# Patient Record
Sex: Male | Born: 1965 | Race: Black or African American | Hispanic: No | Marital: Married | Smoking: Current every day smoker
Health system: Southern US, Community
[De-identification: ages and names within clinical notes are randomized; demographics above are authoritative.]

## PROBLEM LIST (undated history)

## (undated) DIAGNOSIS — J449 Chronic obstructive pulmonary disease, unspecified: Secondary | ICD-10-CM

## (undated) DIAGNOSIS — Z789 Other specified health status: Secondary | ICD-10-CM

## (undated) DIAGNOSIS — K4090 Unilateral inguinal hernia, without obstruction or gangrene, not specified as recurrent: Secondary | ICD-10-CM

---

## 1999-05-03 ENCOUNTER — Encounter: Payer: Self-pay | Admitting: Emergency Medicine

## 1999-05-03 ENCOUNTER — Emergency Department (HOSPITAL_COMMUNITY): Admission: EM | Admit: 1999-05-03 | Discharge: 1999-05-03 | Payer: Self-pay | Admitting: Emergency Medicine

## 2000-09-19 ENCOUNTER — Emergency Department (HOSPITAL_COMMUNITY): Admission: EM | Admit: 2000-09-19 | Discharge: 2000-09-19 | Payer: Self-pay | Admitting: Emergency Medicine

## 2000-12-09 ENCOUNTER — Emergency Department (HOSPITAL_COMMUNITY): Admission: EM | Admit: 2000-12-09 | Discharge: 2000-12-09 | Payer: Self-pay | Admitting: Emergency Medicine

## 2001-12-10 ENCOUNTER — Emergency Department (HOSPITAL_COMMUNITY): Admission: EM | Admit: 2001-12-10 | Discharge: 2001-12-10 | Payer: Self-pay | Admitting: Emergency Medicine

## 2004-10-30 ENCOUNTER — Emergency Department (HOSPITAL_COMMUNITY): Admission: EM | Admit: 2004-10-30 | Discharge: 2004-10-31 | Payer: Self-pay | Admitting: Emergency Medicine

## 2004-12-21 ENCOUNTER — Emergency Department (HOSPITAL_COMMUNITY): Admission: EM | Admit: 2004-12-21 | Discharge: 2004-12-21 | Payer: Self-pay | Admitting: Emergency Medicine

## 2006-02-05 ENCOUNTER — Emergency Department (HOSPITAL_COMMUNITY): Admission: EM | Admit: 2006-02-05 | Discharge: 2006-02-05 | Payer: Self-pay | Admitting: Emergency Medicine

## 2006-03-04 ENCOUNTER — Emergency Department (HOSPITAL_COMMUNITY): Admission: EM | Admit: 2006-03-04 | Discharge: 2006-03-04 | Payer: Self-pay | Admitting: Emergency Medicine

## 2006-03-14 ENCOUNTER — Encounter: Admission: RE | Admit: 2006-03-14 | Discharge: 2006-03-14 | Payer: Self-pay | Admitting: Surgery

## 2006-03-15 ENCOUNTER — Encounter: Admission: RE | Admit: 2006-03-15 | Discharge: 2006-03-15 | Payer: Self-pay | Admitting: Surgery

## 2010-08-20 ENCOUNTER — Encounter: Payer: Self-pay | Admitting: Surgery

## 2012-06-29 ENCOUNTER — Encounter (HOSPITAL_COMMUNITY): Payer: Self-pay | Admitting: *Deleted

## 2012-06-29 ENCOUNTER — Emergency Department (HOSPITAL_COMMUNITY): Payer: No Typology Code available for payment source

## 2012-06-29 ENCOUNTER — Emergency Department (HOSPITAL_COMMUNITY)
Admission: EM | Admit: 2012-06-29 | Discharge: 2012-06-29 | Disposition: A | Discharge status: Home / Self Care | Payer: No Typology Code available for payment source | Attending: Emergency Medicine | Admitting: Emergency Medicine

## 2012-06-29 DIAGNOSIS — S0990XA Unspecified injury of head, initial encounter: Secondary | ICD-10-CM | POA: Insufficient documentation

## 2012-06-29 DIAGNOSIS — Y92410 Unspecified street and highway as the place of occurrence of the external cause: Secondary | ICD-10-CM | POA: Insufficient documentation

## 2012-06-29 DIAGNOSIS — F17200 Nicotine dependence, unspecified, uncomplicated: Secondary | ICD-10-CM | POA: Insufficient documentation

## 2012-06-29 DIAGNOSIS — J329 Chronic sinusitis, unspecified: Secondary | ICD-10-CM | POA: Insufficient documentation

## 2012-06-29 DIAGNOSIS — S199XXA Unspecified injury of neck, initial encounter: Secondary | ICD-10-CM | POA: Insufficient documentation

## 2012-06-29 DIAGNOSIS — S0993XA Unspecified injury of face, initial encounter: Secondary | ICD-10-CM | POA: Insufficient documentation

## 2012-06-29 DIAGNOSIS — Y9389 Activity, other specified: Secondary | ICD-10-CM | POA: Insufficient documentation

## 2012-06-29 MED ORDER — CYCLOBENZAPRINE HCL 10 MG PO TABS: 10.0000 mg | ORAL_TABLET | Freq: Two times a day (BID) | ORAL | Status: DC | PRN

## 2012-06-29 MED ORDER — IBUPROFEN 400 MG PO TABS
600.0000 mg | ORAL_TABLET | Freq: Once | ORAL | Status: AC
  Administered 2012-06-29: 600 mg via ORAL
  Filled 2012-06-29: qty 3

## 2012-06-29 NOTE — ED Notes (Signed)
PT ambulated with baseline gait; VSS; A&Ox3; no signs of distress; respirations even and unlabored; skin warm and dry; no questions upon discharge.  

## 2012-06-29 NOTE — ED Notes (Signed)
Pt c/o neck pain radiating to head; reports he has a migraine. Reports neck tender to touch.
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
## 2012-06-29 NOTE — ED Notes (Signed)
C collar placed at this time.

## 2012-06-29 NOTE — ED Notes (Signed)
Pt returned from CT and xray; reports pain is better.

## 2012-06-29 NOTE — ED Notes (Signed)
Patient transported to X-ray 

## 2012-07-01 NOTE — ED Provider Notes (Signed)
Medical screening examination/treatment/procedure(s) were performed by non-physician practitioner and as supervising physician I was immediately available for consultation/collaboration.  Celene Kras, MD 07/01/12 [phone]

## 2013-01-05 ENCOUNTER — Encounter (HOSPITAL_COMMUNITY): Payer: Self-pay | Admitting: *Deleted

## 2013-01-05 ENCOUNTER — Emergency Department (INDEPENDENT_AMBULATORY_CARE_PROVIDER_SITE_OTHER)
Admission: EM | Admit: 2013-01-05 | Discharge: 2013-01-05 | Disposition: A | Discharge status: Home / Self Care | Payer: Self-pay | Source: Home / Self Care | Attending: Family Medicine | Admitting: Family Medicine

## 2013-01-05 DIAGNOSIS — S46812A Strain of other muscles, fascia and tendons at shoulder and upper arm level, left arm, initial encounter: Secondary | ICD-10-CM

## 2013-01-05 DIAGNOSIS — S46819A Strain of other muscles, fascia and tendons at shoulder and upper arm level, unspecified arm, initial encounter: Secondary | ICD-10-CM

## 2013-01-05 MED ORDER — CYCLOBENZAPRINE HCL 10 MG PO TABS: 10.0000 mg | ORAL_TABLET | Freq: Two times a day (BID) | ORAL | Status: DC | PRN

## 2013-01-05 MED ORDER — IBUPROFEN 600 MG PO TABS: 600.0000 mg | ORAL_TABLET | Freq: Three times a day (TID) | ORAL | Status: DC | PRN

## 2013-01-05 MED ORDER — TRAMADOL HCL 50 MG PO TABS: 50.0000 mg | ORAL_TABLET | Freq: Four times a day (QID) | ORAL | Status: DC | PRN

## 2013-01-05 NOTE — ED Notes (Signed)
Pt  Reports  Pain  And  Stiffness  To  Neck   Radiating  l  Shoulder Pt  Reports  Some   Tingling  Down l  Arm  denys  Any  Loss  Of  Bowel  Bladder    Function  Denys  Any  Recent  Injury
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
## 2015-01-20 ENCOUNTER — Encounter (HOSPITAL_COMMUNITY): Payer: Self-pay | Admitting: Emergency Medicine

## 2015-01-20 ENCOUNTER — Emergency Department (INDEPENDENT_AMBULATORY_CARE_PROVIDER_SITE_OTHER)
Admission: EM | Admit: 2015-01-20 | Discharge: 2015-01-20 | Disposition: A | Discharge status: Home / Self Care | Payer: Self-pay | Source: Home / Self Care | Attending: Family Medicine | Admitting: Family Medicine

## 2015-01-20 DIAGNOSIS — L03011 Cellulitis of right finger: Secondary | ICD-10-CM

## 2015-01-20 MED ORDER — CEPHALEXIN 500 MG PO CAPS: 500.0000 mg | ORAL_CAPSULE | Freq: Four times a day (QID) | ORAL | Status: DC

## 2015-01-20 MED ORDER — HYDROCODONE-ACETAMINOPHEN 7.5-325 MG PO TABS: 1.0000 | ORAL_TABLET | ORAL | Status: DC | PRN

## 2015-01-20 NOTE — ED Provider Notes (Signed)
CSN: [id number]     Arrival date & time 01/20/15  1609 History   First MD Initiated Contact with Patient 01/20/15 1724     Chief Complaint  Patient presents with  . Finger Injury   (Consider location/radiation/quality/duration/timing/severity/associated sxs/prior Treatment) HPI Comments: 30 show male presents pain is swelling to the right distal fifth finger. He states that he chews on his fingernails. He developed swelling at the base of the nail as well as the medial aspect. His wife took a pin and punctured it producing a small amount of pus. There was some improvement for about 24 hours then the swelling increased at a greater amount. There is redness tenderness and swelling to the medial and base of the right fifth fingernail.   History reviewed. No pertinent past medical history. History reviewed. No pertinent past surgical history. No family history on file. History  Substance Use Topics  . Smoking status: Current Every Day Smoker    Types: Cigarettes  . Smokeless tobacco: Not on file  . Alcohol Use: Yes     Comment: occ    Review of Systems  Constitutional: Negative.   Musculoskeletal: Negative.   Skin: Positive for wound.       As per history of present illness.    Allergies  Shrimp  Home Medications   Prior to Admission medications   Medication Sig Start Date End Date Taking? Authorizing Provider  cephALEXin (KEFLEX) 500 MG capsule Take 1 capsule (500 mg total) by mouth 4 (four) times daily. 01/20/15   Hayden Rasmussen, NP  HYDROcodone-acetaminophen (NORCO) 7.5-325 MG per tablet Take 1 tablet by mouth every 4 (four) hours as needed. 01/20/15   Hayden Rasmussen, NP  ibuprofen (ADVIL,MOTRIN) 600 MG tablet Take 1 tablet (600 mg total) by mouth every 8 (eight) hours as needed for pain. Take with food 01/05/13   Adlih Moreno-Coll, MD   BP 122/84 mmHg  Pulse 66  Temp(Src) 98.1 F (36.7 C) (Oral)  Resp 16  SpO2 100% Physical Exam  Constitutional: He is oriented to person, place,  and time. He appears well-developed and well-nourished. No distress.  Pulmonary/Chest: Effort normal. No respiratory distress.  Musculoskeletal: Normal range of motion. He exhibits tenderness.  Distal neurovascular motor sensory of the right fifth digit is intact. There is swelling, tenderness surrounding the base and medial aspect of the nail. The nail itself is intact. Flexion and extension of the DIP and PIP are intact.  Neurological: He is alert and oriented to person, place, and time. He exhibits normal muscle tone.  Skin: Skin is warm.  Psychiatric: He has a normal mood and affect.  Nursing note and vitals reviewed.   ED Course  Drain paronychia Date/Time: 01/20/2015 6:07 PM Performed by: Phineas Real, Dannisha Eckmann Authorized by: Ozella Rocks Consent: Verbal consent obtained. Risks and benefits: risks, benefits and alternatives were discussed Consent given by: patient Patient understanding: patient states understanding of the procedure being performed Patient identity confirmed: verbally with patient Preparation: Patient was prepped and draped in the usual sterile fashion. Local anesthesia used: yes Local anesthetic: topical anesthetic Patient sedated: no Patient tolerance: Patient tolerated the procedure well with no immediate complications Comments: Cold/freeze spray. Tangential incision to paronychia and drain in warm water.    (including critical care time) Labs Review Labs Reviewed - No data to display  Imaging Review No results found.   MDM   1. Paronychia of fifth finger of right hand    Warm water soaks Bandaid, keep covered norco 7.5 mg #  15 Ibuprofen 600 mg q 6h prn Keflex 500 Return if needed     Hayden Rasmussen, NP 01/20/15 1813

## 2015-01-20 NOTE — ED Notes (Signed)
Noticed pain and swelling to right little finger last week.  Patient admits to poking finger with a needle and reports pus came out of finger.  Since Monday finger has worsened.    Finger is swollen around end of finger and around nailbed

## 2015-01-20 NOTE — Discharge Instructions (Signed)

## 2015-01-24 LAB — CULTURE, ROUTINE-ABSCESS: SPECIAL REQUESTS: NORMAL

## 2015-01-24 NOTE — ED Notes (Signed)
Final report shows multiple species 

## 2015-07-27 ENCOUNTER — Encounter (HOSPITAL_COMMUNITY): Payer: Self-pay | Admitting: *Deleted

## 2015-07-27 ENCOUNTER — Emergency Department (HOSPITAL_COMMUNITY)
Admission: EM | Admit: 2015-07-27 | Discharge: 2015-07-27 | Disposition: A | Discharge status: Home / Self Care | Payer: Self-pay | Attending: Emergency Medicine | Admitting: Emergency Medicine

## 2015-07-27 ENCOUNTER — Emergency Department (HOSPITAL_COMMUNITY): Payer: Self-pay

## 2015-07-27 DIAGNOSIS — R079 Chest pain, unspecified: Secondary | ICD-10-CM | POA: Insufficient documentation

## 2015-07-27 DIAGNOSIS — R1909 Other intra-abdominal and pelvic swelling, mass and lump: Secondary | ICD-10-CM

## 2015-07-27 DIAGNOSIS — R1032 Left lower quadrant pain: Secondary | ICD-10-CM | POA: Insufficient documentation

## 2015-07-27 DIAGNOSIS — F17210 Nicotine dependence, cigarettes, uncomplicated: Secondary | ICD-10-CM | POA: Insufficient documentation

## 2015-07-27 HISTORY — DX: Other specified health status: Z78.9

## 2015-07-27 LAB — CBC WITH DIFFERENTIAL/PLATELET
BASOS PCT: 1 %
Basophils Absolute: 0 10*3/uL (ref 0.0–0.1)
EOS PCT: 9 %
Eosinophils Absolute: 0.3 10*3/uL (ref 0.0–0.7)
HEMATOCRIT: 38.2 % — AB (ref 39.0–52.0)
Hemoglobin: 13 g/dL (ref 13.0–17.0)
LYMPHS PCT: 43 %
Lymphs Abs: 1.3 10*3/uL (ref 0.7–4.0)
MCH: 31.6 pg (ref 26.0–34.0)
MCHC: 34 g/dL (ref 30.0–36.0)
MCV: 92.7 fL (ref 78.0–100.0)
MONO ABS: 0.2 10*3/uL (ref 0.1–1.0)
MONOS PCT: 8 %
NEUTROS ABS: 1.1 10*3/uL — AB (ref 1.7–7.7)
Neutrophils Relative %: 39 %
PLATELETS: 209 10*3/uL (ref 150–400)
RBC: 4.12 MIL/uL — ABNORMAL LOW (ref 4.22–5.81)
RDW: 13.4 % (ref 11.5–15.5)
WBC: 2.9 10*3/uL — ABNORMAL LOW (ref 4.0–10.5)

## 2015-07-27 LAB — COMPREHENSIVE METABOLIC PANEL
ALK PHOS: 53 U/L (ref 38–126)
ALT: 13 U/L — ABNORMAL LOW (ref 17–63)
AST: 26 U/L (ref 15–41)
Albumin: 3.9 g/dL (ref 3.5–5.0)
Anion gap: 7 (ref 5–15)
BILIRUBIN TOTAL: 0.5 mg/dL (ref 0.3–1.2)
BUN: 14 mg/dL (ref 6–20)
CALCIUM: 9.4 mg/dL (ref 8.9–10.3)
CO2: 26 mmol/L (ref 22–32)
Chloride: 107 mmol/L (ref 101–111)
Creatinine, Ser: 1.11 mg/dL (ref 0.61–1.24)
GFR calc Af Amer: >60 mL/min (ref >60)
GFR calc non Af Amer: >60 mL/min (ref >60)
GLUCOSE: 91 mg/dL (ref 65–99)
Potassium: 4.5 mmol/L (ref 3.5–5.1)
Sodium: 140 mmol/L (ref 135–145)
TOTAL PROTEIN: 6.8 g/dL (ref 6.5–8.1)

## 2015-07-27 MED ORDER — ONDANSETRON HCL 4 MG/2ML IJ SOLN
4.0000 mg | Freq: Once | INTRAMUSCULAR | Status: AC
  Administered 2015-07-27: 4 mg via INTRAVENOUS
  Filled 2015-07-27: qty 2

## 2015-07-27 MED ORDER — HYDROMORPHONE HCL 1 MG/ML IJ SOLN
1.0000 mg | Freq: Once | INTRAMUSCULAR | Status: AC
  Administered 2015-07-27: 1 mg via INTRAVENOUS
  Filled 2015-07-27: qty 1

## 2015-07-27 MED ORDER — TRAMADOL HCL 50 MG PO TABS: ORAL_TABLET | ORAL | Status: DC

## 2015-07-27 MED ORDER — LORAZEPAM 2 MG/ML IJ SOLN
1.0000 mg | Freq: Once | INTRAMUSCULAR | Status: AC
  Administered 2015-07-27: 1 mg via INTRAVENOUS
  Filled 2015-07-27: qty 1

## 2015-07-27 MED ORDER — IOHEXOL 300 MG/ML  SOLN
100.0000 mL | Freq: Once | INTRAMUSCULAR | Status: AC | PRN
  Administered 2015-07-27: 100 mL via INTRAVENOUS

## 2015-07-27 NOTE — ED Notes (Signed)
Pt states that he was lifting things at work yesterday. Pt states that he now notices a knot on his left side of his groin.

## 2015-07-27 NOTE — ED Notes (Signed)
Pt is in stable condition upon d/c and ambulates from ED. 

## 2015-07-27 NOTE — ED Notes (Signed)
Pt ambulated to the bathroom well.

## 2015-07-27 NOTE — Discharge Instructions (Signed)
Follow-up with [facility] in 1-2 weeks to discuss removing this cyst

## 2015-07-27 NOTE — ED Notes (Signed)
Pt has a golf ball sized knot present in right groin that is tender to palpation.

## 2015-07-27 NOTE — H&P (Signed)
Chief Complaint: left groin pain HPI: [facility] [employer] is a healthy 49 year old male presenting with left groin pain and bulge.  Duration of bulge is several months.  Apparently seen at an urgent care, but wasn't sure of the cause.  He presents today with pain which is new since yesterday afternoon.  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  He denies nausea, vomiting, abdominal pain or constipation.  Denies recent weight loss.  Denies a history of cancer or family history of cancer. Appetite has been good.  Denies melena or hematochezia.  Last PO intake was last night.  Work up shows WBC at 2.9.  We have been asked to evaluate for a left inguinal hernia.     Past Medical History  Diagnosis Date  . Medical history non-contributory     History reviewed. No pertinent past surgical history.  History reviewed. No pertinent family history. Social History:  reports that he has been smoking Cigarettes.  He has been smoking about 1.00 pack per day. He does not have any smokeless tobacco history on file. He reports that he drinks about 1.2 oz of alcohol per week. He reports that he uses illicit drugs (Marijuana).  Allergies:  Allergies  Allergen Reactions  . Shrimp [Shellfish Allergy] Anaphylaxis and Hives    Non De-veined shrimp   . Aleve [Naproxen] Hives and Itching     (Not in a hospital admission)  Results for orders placed or performed during the hospital encounter of 07/27/15 (from the past 48 hour(s))  CBC with Differential/Platelet     Status: Abnormal   Collection Time: 07/27/15  9:55 AM  Result Value Ref Range   WBC 2.9 (L) 4.0 - 10.5 K/uL   RBC 4.12 (L) 4.22 - 5.81 MIL/uL   Hemoglobin 13.0 13.0 - 17.0 g/dL   HCT 38.2 (L) 39.0 - 52.0 %   MCV 92.7 78.0 - 100.0 fL   MCH 31.6 26.0 - 34.0 pg   MCHC 34.0 30.0 - 36.0 g/dL   RDW 13.4 11.5 - 15.5 %   Platelets 209 150 - 400 K/uL   Neutrophils Relative % 39 %   Neutro Abs 1.1 (L) 1.7 - 7.7 K/uL   Lymphocytes Relative 43 %   Lymphs Abs 1.3 0.7 - 4.0 K/uL   Monocytes Relative 8 %   Monocytes Absolute 0.2 0.1 - 1.0 K/uL   Eosinophils Relative 9 %   Eosinophils Absolute 0.3 0.0 - 0.7 K/uL   Basophils Relative 1 %   Basophils Absolute 0.0 0.0 - 0.1 K/uL  Comprehensive metabolic panel     Status: Abnormal   Collection Time: 07/27/15  9:55 AM  Result Value Ref Range   Sodium 140 135 - 145 mmol/L   Potassium 4.5 3.5 - 5.1 mmol/L    Comment: SPECIMEN HEMOLYZED. HEMOLYSIS MAY AFFECT INTEGRITY OF RESULTS.   Chloride 107 101 - 111 mmol/L   CO2 26 22 - 32 mmol/L   Glucose, Bld 91 65 - 99 mg/dL   BUN 14 6 - 20 mg/dL   Creatinine, Ser 1.11 0.61 - 1.24 mg/dL   Calcium 9.4 8.9 - 10.3 mg/dL   Total Protein 6.8 6.5 - 8.1 g/dL   Albumin 3.9 3.5 - 5.0 g/dL   AST 26 15 - 41 U/L   ALT 13 (L) 17 - 63 U/L   Alkaline Phosphatase 53 38 - 126 U/L   Total Bilirubin 0.5 0.3 - 1.2 mg/dL   GFR calc non Af Amer >60 >60 mL/min  GFR calc Af Amer >60 >60 mL/min    Comment: (NOTE) The eGFR has been calculated using the CKD EPI equation. This calculation has not been validated in all clinical situations. eGFR's persistently <60 mL/min signify possible Chronic Kidney Disease.    Anion gap 7 5 - 15   No results found.  Review of Systems  Constitutional: Negative for fever, chills, weight loss, malaise/fatigue and diaphoresis.  Respiratory: Negative for cough, hemoptysis, sputum production, shortness of breath and wheezing.   Cardiovascular: Positive for chest pain. Negative for palpitations, orthopnea, claudication, leg swelling and PND.  Gastrointestinal: Negative for heartburn, nausea, vomiting, abdominal pain, diarrhea, constipation, blood in stool and melena.  Genitourinary: Negative for dysuria, urgency, frequency, hematuria and flank pain.  Musculoskeletal: Negative for myalgias, back pain, joint pain, falls and neck pain.  Neurological: Negative for dizziness, tingling, tremors, sensory change, speech change, focal  weakness, seizures, loss of consciousness, weakness and headaches.  Psychiatric/Behavioral: Negative for suicidal ideas, memory loss and substance abuse. The patient is not nervous/anxious.     Blood pressure 108/86, pulse 53, temperature 98.2 F (36.8 C), temperature source Oral, resp. rate 16, SpO2 98 %. Physical Exam  Constitutional: He is oriented to person, place, and time. He appears well-developed and well-nourished. No distress.  Neck: Normal range of motion. Neck supple.  Cardiovascular: Normal rate, regular rhythm, normal heart sounds and intact distal pulses.  Exam reveals no gallop and no friction rub.   No murmur heard. Respiratory: Effort normal and breath sounds normal.  GI: Soft. Bowel sounds are normal. He exhibits no distension. There is no tenderness. There is no rebound and no guarding.  Genitourinary:  No adenopathy or hernias appreciated on the right. Approximately 4cm hard, movable mass most consistent with a enlarged lymph node rather than a hernia.   Musculoskeletal: Normal range of motion. He exhibits no edema or tenderness.  Neurological: He is alert and oriented to person, place, and time.  Skin: Skin is warm and dry. No rash noted. He is not diaphoretic. No erythema. No pallor.  Psychiatric: He has a normal mood and affect. His behavior is normal. Judgment and thought content normal.     Assessment/Plan Left groin mass-more consistent with an enlarged lymph node rather than a hernia.  Recommend CT for further evaluation.  Further recommendations to follow pending results.    Jernee Murtaugh ANP-BC 07/27/2015, 11:07 AM

## 2015-07-27 NOTE — ED Provider Notes (Signed)
CSN: [id number]     Arrival date & time 07/27/15  0806 History   First MD Initiated Contact with Patient 07/27/15 [phone]     Chief Complaint  Patient presents with  . Groin Pain     (Consider location/radiation/quality/duration/timing/severity/associated sxs/prior Treatment) Patient is a 49 y.o. male presenting with groin pain. The history is provided by the patient (Patient complains of left groin mass that has become worse as of yesterday larger and more painful).  Groin Pain This is a recurrent problem. The current episode started yesterday. The problem occurs constantly. The problem has not changed since onset.Pertinent negatives include no chest pain, no abdominal pain and no headaches. Nothing aggravates the symptoms. Nothing relieves the symptoms.    Past Medical History  Diagnosis Date  . Medical history non-contributory    History reviewed. No pertinent past surgical history. History reviewed. No pertinent family history. Social History  Substance Use Topics  . Smoking status: Current Every Day Smoker -- 1.00 packs/day    Types: Cigarettes  . Smokeless tobacco: None  . Alcohol Use: 1.2 oz/week    2 Cans of beer per week     Comment: 24oz beer every day    Review of Systems  Constitutional: Negative for appetite change and fatigue.  HENT: Negative for congestion, ear discharge and sinus pressure.   Eyes: Negative for discharge.  Respiratory: Negative for cough.   Cardiovascular: Negative for chest pain.  Gastrointestinal: Negative for abdominal pain and diarrhea.       Groin pain  Genitourinary: Negative for frequency and hematuria.  Musculoskeletal: Negative for back pain.  Skin: Negative for rash.  Neurological: Negative for seizures and headaches.  Psychiatric/Behavioral: Negative for hallucinations.      Allergies  Shrimp and Aleve  Home Medications   Prior to Admission medications   Not on File   BP 114/80 mmHg  Pulse 57  Temp(Src) 98.2 F (36.8  C) (Oral)  Resp 16  SpO2 99% Physical Exam  Constitutional: He is oriented to person, place, and time. He appears well-developed.  HENT:  Head: Normocephalic.  Eyes: Conjunctivae and EOM are normal. No scleral icterus.  Neck: Neck supple. No thyromegaly present.  Cardiovascular: Normal rate and regular rhythm.  Exam reveals no gallop and no friction rub.   No murmur heard. Pulmonary/Chest: No stridor. He has no wheezes. He has no rales. He exhibits no tenderness.  Abdominal: He exhibits no distension. There is no tenderness. There is no rebound.  Left inguinal mass nonreducible  Musculoskeletal: Normal range of motion. He exhibits no edema.  Lymphadenopathy:    He has no cervical adenopathy.  Neurological: He is oriented to person, place, and time. He exhibits normal muscle tone. Coordination normal.  Skin: No rash noted. No erythema.  Psychiatric: He has a normal mood and affect. His behavior is normal.    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 Sagittal and coronal MPR images reconstructed from axial data set. CONTRAST:  OMNIPAQUE IOHEXOL 300 MG/ML SOLN IV. No GI contrast administered. COMPARISON:  03/15/2006 FINDINGS: Cyst at inferior pole LEFT kidney 2.8 x 2.8 cm image 1. Unremarkable  bladder and ureters. Minimal prostatic enlargement. Small LEFT hydrocele within the scrotum. Fluid collection with enhancing margins identified within the LEFT inguinal canal with an additional small fluid collection seen at the internal inguinal ring. Main collection measures approximately 4.7 x 2.6 x 1.6 cm. This represents a decrease in size from the previous study from 03/15/2006. No definite bowel herniation or evidence of obstruction. Finding likely represents a loculated hydrocele extending up the LEFT inguinal canal. No adenopathy. Few normal sized inguinal lymph nodes are identified bilaterally. Remaining bowel loops unremarkable without dilatation or obvious wall thickening, though assessment is suboptimal due the lack of GI contrast. No mass, adenopathy, free air or free fluid. Osseous structures normal. IMPRESSION: Mass of the LEFT inguinal region represents loculated fluid collection within the LEFT inguinal canal measuring 4.7 x 2.6 x 1.6 cm, significantly decreased in size since 2007, likely representing a loculated hydrocele of the LEFT inguinal canal. No definite bowel herniation or adenopathy identified. This could be further imaged by ultrasound if clinically indicated, which would confirm absence of bowel herniation and adenopathy. Electronically Signed   By: Ulyses Southward M.D.   On: 07/27/2015 12:46   I have personally reviewed and evaluated these images and lab results as part of my medical decision-making.   EKG Interpretation None      MDM   Final diagnoses:  Groin mass    Inguinal mass. Patient seen by surgery they recommend CT scan. CT scan shows patient has a loculated hydrocele. Patient will be sent home with Ultram and will follow-up with sent to [facility] surgery    Bethann Berkshire, MD 07/27/15 1328

## 2018-01-13 ENCOUNTER — Encounter (HOSPITAL_COMMUNITY): Payer: Self-pay

## 2018-01-13 ENCOUNTER — Other Ambulatory Visit: Payer: Self-pay

## 2018-01-13 ENCOUNTER — Emergency Department (HOSPITAL_COMMUNITY)
Admission: EM | Admit: 2018-01-13 | Discharge: 2018-01-13 | Disposition: A | Discharge status: Home / Self Care | Payer: Self-pay | Attending: Emergency Medicine | Admitting: Emergency Medicine

## 2018-01-13 ENCOUNTER — Emergency Department (HOSPITAL_COMMUNITY): Payer: Self-pay

## 2018-01-13 DIAGNOSIS — Z79899 Other long term (current) drug therapy: Secondary | ICD-10-CM | POA: Insufficient documentation

## 2018-01-13 DIAGNOSIS — F17210 Nicotine dependence, cigarettes, uncomplicated: Secondary | ICD-10-CM | POA: Insufficient documentation

## 2018-01-13 DIAGNOSIS — M545 Low back pain, unspecified: Secondary | ICD-10-CM

## 2018-01-13 DIAGNOSIS — M25551 Pain in right hip: Secondary | ICD-10-CM | POA: Insufficient documentation

## 2018-01-13 MED ORDER — ACETAMINOPHEN 500 MG PO TABS: 500.0000 mg | ORAL_TABLET | Freq: Four times a day (QID) | ORAL | 0 refills | Status: DC | PRN

## 2018-01-13 MED ORDER — PREDNISONE 10 MG (21) PO TBPK: ORAL_TABLET | Freq: Every day | ORAL | 0 refills | Status: DC

## 2018-01-13 MED ORDER — CYCLOBENZAPRINE HCL 10 MG PO TABS: 10.0000 mg | ORAL_TABLET | Freq: Two times a day (BID) | ORAL | 0 refills | Status: DC | PRN

## 2018-01-13 NOTE — ED Notes (Signed)
Pt ambulated to room from waiting room. 

## 2018-01-13 NOTE — ED Provider Notes (Signed)
[facility] EMERGENCY DEPARTMENT Provider Note   CSN: [id number] Arrival date & time: 01/13/18  1227     History   Chief Complaint Chief Complaint  Patient presents with  . Hip Pain    HPI John Hopkins is a 52 y.o. male with no significant past medical history presents for evaluation of acute onset, intermittent right hip pain for 3 days.  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  Pain is been intermittent, primarily aching but with some sharp pain.  Pain will radiate up to the right side of the back.  No bowel or bladder incontinence, numbness, weakness, saddle anesthesia, fevers, or history of IV drug use.  Pain worsens with ambulation and flexion of the right hip.  He tried a friend's muscle relaxer last night which he states was helpful.  Has not tried anything else for his symptoms.  The history is provided by the patient.    Past Medical History:  Diagnosis Date  . Medical history non-contributory     There are no active problems to display for this patient.   History reviewed. No pertinent surgical history.      Home Medications    Prior to Admission medications   Medication Sig Start Date End Date Taking? Authorizing Provider  acetaminophen (TYLENOL) 500 MG tablet Take 1 tablet (500 mg total) by mouth every 6 (six) hours as needed. 01/13/18   Charlye Spare A, PA-C  cyclobenzaprine (FLEXERIL) 10 MG tablet Take 1 tablet (10 mg total) by mouth 2 (two) times daily as needed for muscle spasms. 01/13/18   Alaija Ruble A, PA-C  predniSONE (STERAPRED UNI-PAK 21 TAB) 10 MG (21) TBPK tablet Take by mouth daily. Take 6 tabs by mouth daily  for 2 days, then 5 tabs for 2 days, then 4 tabs for 2 days, then 3 tabs for 2 days, 2 tabs for 2 days, then 1 tab by mouth daily for 2 days 01/13/18   Michela Pitcher A, PA-C  traMADol Janean Sark) 50 MG tablet Take one every 8 hours for pain  not helped by tylenol 07/27/15   Bethann Berkshire, MD    Family History History reviewed. No pertinent family history.  Social History Social History   Tobacco Use  . Smoking status: Current Every Day Smoker    Packs/day: 1.00    Types: Cigarettes  . Smokeless tobacco: Never Used  Substance Use Topics  . Alcohol use: Yes    Alcohol/week: 1.2 oz    Types: 2 Cans of beer per week    Comment: 24oz beer every day  . Drug use: Yes    Types: Marijuana     Allergies   Shrimp [shellfish allergy] and Aleve [naproxen]   Review of Systems Review of Systems  Constitutional: Negative for chills and fever.  Musculoskeletal: Positive for arthralgias and back pain.  Neurological: Negative for syncope, weakness and numbness.     Physical Exam Updated Vital Signs BP 112/83 (BP Location: Right Arm)   Pulse 72   Temp 98.3 F (36.8 C) (Oral)   Resp 16   SpO2 100%   Physical Exam  Constitutional: He appears well-developed and well-nourished. No distress.  HENT:  Head: Normocephalic and atraumatic.  Eyes: Conjunctivae are normal. Right eye exhibits no discharge. Left eye exhibits no discharge.  Neck: No JVD present. No tracheal deviation present.  Cardiovascular: Normal rate and  intact distal pulses.  2+ DP/PT pulses bilaterally, no lower extremity edema  Pulmonary/Chest: Effort normal.  Abdominal: He exhibits no distension.  Musculoskeletal: He exhibits no edema.  No midline lumbar spine tenderness to palpation, right paralumbar muscle tenderness noted with no deformity, crepitus, ecchymosis, or step-off noted.  5/5 strength of BLE major muscle groups.  Patient has normal passive and active range of motion of the right hip with pain elicited with active flexion and passive internal rotation.  Neurological: He is alert. No sensory deficit. He exhibits normal muscle tone.  Fluent speech with no evidence of dysarthria or aphasia, no facial droop, sensation intact to soft touch of  bilateral lower extremities.  Patient ambulates with a mildly antalgic gait with a limp favoring his left side, able to Heel Walk and Toe Walk without difficulty.  Skin: Skin is warm and dry. No erythema.  Psychiatric: He has a normal mood and affect. His behavior is normal.  Nursing note and vitals reviewed.    ED Treatments / Results  Labs (all labs ordered are listed, but only abnormal results are displayed) Labs Reviewed - No data to display  EKG None  Radiology Dg Hip Unilat  With Pelvis 2-3 Views Right  Result Date: 01/13/2018 CLINICAL DATA:  Initial evaluation for acute right hip pain. No injury. EXAM: DG HIP (WITH OR WITHOUT PELVIS) 2-3V RIGHT COMPARISON:  None. FINDINGS: There is no evidence of hip fracture or dislocation. There is no evidence of arthropathy or other focal bone abnormality. IMPRESSION: No acute osseous abnormality about the hip. Electronically Signed   By: Rise MuBenjamin  McClintock M.D.   On: 01/13/2018 14:07    Procedures Procedures (including critical care time)  Medications Ordered in ED Medications - No data to display   Initial Impression / Assessment and Plan / ED Course  I have reviewed the triage vital signs and the nursing notes.  Pertinent labs & imaging results that were available during my care of the patient were reviewed by me and considered in my medical decision making (see chart for details).     Patient with right hip pain for 3 days.  He is afebrile, vital signs are stable.  No trauma or fall but he did have prolonged flexion while gardening and sudden movement while standing.  Neurovascularly intact, ambulatory despite pain.  He is able to bear weight on the extremity.  Radiographs show no acute osseous abnormalities.  He has no midline spine tenderness, no red flag signs concerning for cauda equina or spinal abscess.  No concern for septic joint, gout, osteomyelitis, or DVT. RICE therapy indicated and discussed with patient.  Will discharge  with steroid taper and Flexeril.  Discussed  appropriate use of these medicines.  Discussed strict ED return precautions.  Recommend follow-up with orthopedist or PCP if symptoms persist. Pt verbalized understanding of and agreement with plan and is safe for discharge home at this time.    Final Clinical Impressions(s) / ED Diagnoses   Final diagnoses:  Right hip pain  Acute right-sided low back pain without sciatica    ED Discharge Orders        Ordered    predniSONE (STERAPRED UNI-PAK 21 TAB) 10 MG (21) TBPK tablet  Daily     01/13/18 1522    cyclobenzaprine (FLEXERIL) 10 MG tablet  2 times daily PRN     01/13/18 1522    acetaminophen (TYLENOL) 500 MG tablet  Every 6 hours PRN     01/13/18 1522  Jeanie Sewer, PA-C 01/13/18 1523    Derwood Kaplan, MD 01/14/18 [phone]

## 2018-01-13 NOTE — ED Triage Notes (Signed)
Pt states he has been having right hip pain with radiation into his back that started Saturday. He states pain was bad yesterday and he could barely walk. Pt states pain is worse with moving his leg.

## 2018-01-13 NOTE — Discharge Instructions (Signed)
Start taking prednisone taper as prescribed.  Do not take ibuprofen, Advil, Aleve, or Motrin while taking this medicine.  You may however take [phone] mill grams of Tylenol every 6 hours as needed for pain in addition to the prednisone.  You may take Flexeril as needed for muscle spasm, I primarily recommend taking this at night.  Do not drive, drink alcohol, or operate heavy machinery while taking this medicine as it may make you drowsy.  You may also cut this medicine in half if they make you too drowsy.  Apply heat 20 minutes on 20 minutes off for comfort.  Take some hot showers or hot baths and do some gentle stretching exercises (see attached) to avoid muscle stiffness.  Follow-up with your primary care physician or an orthopedist for reevaluation of your symptoms.  Return to the emergency department immediately for any concerning signs or symptoms develop such as inability to walk, fevers, redness or swelling of the joint, or loss of pulses.

## 2019-03-26 ENCOUNTER — Encounter (HOSPITAL_COMMUNITY): Payer: Self-pay | Admitting: Emergency Medicine

## 2019-03-26 ENCOUNTER — Other Ambulatory Visit: Payer: Self-pay

## 2019-03-26 ENCOUNTER — Ambulatory Visit (HOSPITAL_COMMUNITY)
Admission: EM | Admit: 2019-03-26 | Discharge: 2019-03-26 | Disposition: A | Discharge status: Home / Self Care | Payer: Self-pay | Attending: Internal Medicine | Admitting: Internal Medicine

## 2019-03-26 DIAGNOSIS — R220 Localized swelling, mass and lump, head: Secondary | ICD-10-CM

## 2019-03-26 MED ORDER — DEXAMETHASONE SODIUM PHOSPHATE 10 MG/ML IJ SOLN
10.0000 mg | Freq: Once | INTRAMUSCULAR | Status: AC
  Administered 2019-03-26: 15:00:00 10 mg via INTRAMUSCULAR

## 2019-03-26 MED ORDER — DEXAMETHASONE SODIUM PHOSPHATE 10 MG/ML IJ SOLN
INTRAMUSCULAR | Status: AC
  Filled 2019-03-26: qty 1

## 2019-03-26 NOTE — ED Provider Notes (Signed)
[place]    CSN: [id number] Arrival date & time: 03/26/19  1335      History   Chief Complaint Chief Complaint  Patient presents with  . Allergic Reaction    HPI John Hopkins is a 53 y.o. male no past medical history comes to urgent care with 1 day history of upper lip swelling.  Swelling has been worsening since morning.  Patient had liver putting in some eggs this morning.  He denies any insect bite.  No trauma to the upper lip.  No shortness of breath, difficulty swallowing, stridor or tongue swelling.  Patient is allergic to shrimp.  No toxic inhalations/exposures. Past Medical History:  Diagnosis Date  . Medical history non-contributory     There are no active problems to display for this patient.   History reviewed. No pertinent surgical history.     Home Medications    Prior to Admission medications   Medication Sig Start Date End Date Taking? Authorizing Provider  acetaminophen (TYLENOL) 500 MG tablet Take 1 tablet (500 mg total) by mouth every 6 (six) hours as needed. 01/13/18   Fawze, Mina A, PA-C  cyclobenzaprine (FLEXERIL) 10 MG tablet Take 1 tablet (10 mg total) by mouth 2 (two) times daily as needed for muscle spasms. Patient not taking: Reported on 03/26/2019 01/13/18   Rodell Perna A, PA-C  predniSONE (STERAPRED UNI-PAK 21 TAB) 10 MG (21) TBPK tablet Take by mouth daily. Take 6 tabs by mouth daily  for 2 days, then 5 tabs for 2 days, then 4 tabs for 2 days, then 3 tabs for 2 days, 2 tabs for 2 days, then 1 tab by mouth daily for 2 days Patient not taking: Reported on 03/26/2019 01/13/18   Rodell Perna A, PA-C  traMADol Veatrice Bourbon) 50 MG tablet Take one every 8 hours for pain not helped by tylenol Patient not taking: Reported on 03/26/2019 07/27/15   Milton Ferguson, MD    Family History History reviewed. No pertinent family history.  Social History Social History   Tobacco Use  . Smoking status: Current Every Day Smoker    Packs/day: 1.00   Types: Cigarettes  . Smokeless tobacco: Never Used  Substance Use Topics  . Alcohol use: Yes    Alcohol/week: 2.0 standard drinks    Types: 2 Cans of beer per week    Comment: 24oz beer every day  . Drug use: Yes    Types: Marijuana     Allergies   Shrimp [shellfish allergy] and Aleve [naproxen]   Review of Systems Review of Systems  Constitutional: Negative.   HENT: Negative.  Negative for trouble swallowing.   Respiratory: Negative for cough, shortness of breath and wheezing.   Cardiovascular: Negative.   Gastrointestinal: Negative.   Genitourinary: Negative.   Musculoskeletal: Negative.   Neurological: Negative for dizziness, weakness and headaches.     Physical Exam Triage Vital Signs ED Triage Vitals  Enc Vitals Group     BP 03/26/19 1404 110/80     Pulse Rate 03/26/19 1404 78     Resp 03/26/19 1404 18     Temp 03/26/19 1404 98.8 F (37.1 C)     Temp Source 03/26/19 1404 Temporal     SpO2 03/26/19 1404 95 %     Weight --      Height --      Head Circumference --      Peak Flow --      Pain Score 03/26/19 1406 4  Pain Loc --      Pain Edu? --      Excl. in GC? --    No data found.  Updated Vital Signs BP 110/80 (BP Location: Left Arm)   Pulse 78   Temp 98.8 F (37.1 C) (Temporal)   Resp 18   SpO2 95%   Visual Acuity Right Eye Distance:   Left Eye Distance:   Bilateral Distance:    Right Eye Near:   Left Eye Near:    Bilateral Near:     Physical Exam Vitals signs and nursing note reviewed.  Constitutional:      Appearance: He is not ill-appearing or toxic-appearing.  HENT:     Mouth/Throat:     Mouth: Mucous membranes are moist.     Comments: Indurated upper lip swelling without erythema.  No discharge.  No tongue swelling.  No swelling of the oropharynx.  Uvula is midline Cardiovascular:     Rate and Rhythm: Normal rate and regular rhythm.     Pulses: Normal pulses.     Heart sounds: Normal heart sounds.  Pulmonary:     Effort:  Pulmonary effort is normal.     Breath sounds: Normal breath sounds.  Abdominal:     General: Bowel sounds are normal.     Palpations: Abdomen is soft.  Skin:    Capillary Refill: Capillary refill takes less than 2 seconds.  Neurological:     General: No focal deficit present.     Mental Status: He is alert and oriented to person, place, and time.      UC Treatments / Results  Labs (all labs ordered are listed, but only abnormal results are displayed) Labs Reviewed - No data to display  EKG   Radiology No results found.  Procedures Procedures (including critical care time)  Medications Ordered in UC Medications - No data to display  Initial Impression / Assessment and Plan / UC Course  I have reviewed the triage vital signs and the nursing notes.  Pertinent labs & imaging results that were available during my care of the patient were reviewed by me and considered in my medical decision making (see chart for details).     1.  Upper lip swelling secondary to possibly allergic reaction: Dexamethasone 10 mg IM I recommended a short course of steroids i.e. prednisone for patient.  He alluded to the fact that he cannot afford it.  I gave him IM dexamethasone.  There is no signs of tongue swelling or airway compromise hence dexamethasone may be adequate for treating the lip swelling. Final Clinical Impressions(s) / UC Diagnoses   Final diagnoses:  None   Discharge Instructions   None    ED Prescriptions    None     Controlled Substance Prescriptions [place] Controlled Substance Registry consulted? No   Merrilee JanskyLamptey,  O, MD 03/26/19 [phone]

## 2019-03-26 NOTE — ED Triage Notes (Signed)
Pt here for upper lip swelling starting this morning; pt denies taking any medications and unsure of cause; denies any other swelling or trouble breathing

## 2020-01-02 ENCOUNTER — Ambulatory Visit: Payer: Self-pay

## 2020-01-03 ENCOUNTER — Other Ambulatory Visit: Payer: Self-pay

## 2020-01-03 ENCOUNTER — Ambulatory Visit (INDEPENDENT_AMBULATORY_CARE_PROVIDER_SITE_OTHER): Payer: HRSA Program

## 2020-01-03 ENCOUNTER — Encounter (HOSPITAL_COMMUNITY): Payer: Self-pay

## 2020-01-03 ENCOUNTER — Ambulatory Visit (HOSPITAL_COMMUNITY)
Admission: EM | Admit: 2020-01-03 | Discharge: 2020-01-03 | Disposition: A | Discharge status: Home / Self Care | Payer: HRSA Program | Attending: Physician Assistant | Admitting: Physician Assistant

## 2020-01-03 DIAGNOSIS — F17210 Nicotine dependence, cigarettes, uncomplicated: Secondary | ICD-10-CM | POA: Diagnosis not present

## 2020-01-03 DIAGNOSIS — R61 Generalized hyperhidrosis: Secondary | ICD-10-CM

## 2020-01-03 DIAGNOSIS — Z20822 Contact with and (suspected) exposure to covid-19: Secondary | ICD-10-CM | POA: Insufficient documentation

## 2020-01-03 DIAGNOSIS — J449 Chronic obstructive pulmonary disease, unspecified: Secondary | ICD-10-CM | POA: Insufficient documentation

## 2020-01-03 DIAGNOSIS — R069 Unspecified abnormalities of breathing: Secondary | ICD-10-CM

## 2020-01-03 DIAGNOSIS — R05 Cough: Secondary | ICD-10-CM

## 2020-01-03 DIAGNOSIS — R0602 Shortness of breath: Secondary | ICD-10-CM

## 2020-01-03 DIAGNOSIS — R197 Diarrhea, unspecified: Secondary | ICD-10-CM

## 2020-01-03 DIAGNOSIS — J189 Pneumonia, unspecified organism: Secondary | ICD-10-CM | POA: Diagnosis not present

## 2020-01-03 HISTORY — DX: Unilateral inguinal hernia, without obstruction or gangrene, not specified as recurrent: K40.90

## 2020-01-03 LAB — SARS CORONAVIRUS 2 (TAT 6-24 HRS): SARS Coronavirus 2: NEGATIVE

## 2020-01-03 MED ORDER — DOXYCYCLINE HYCLATE 100 MG PO CAPS: 100.0000 mg | ORAL_CAPSULE | Freq: Two times a day (BID) | ORAL | 0 refills | Status: DC

## 2020-01-03 MED ORDER — PREDNISONE 10 MG PO TABS
40.0000 mg | ORAL_TABLET | Freq: Every day | ORAL | 0 refills | Status: AC
Start: 2020-01-03 — End: 2020-01-08

## 2020-01-03 MED ORDER — BENZONATATE 100 MG PO CAPS
100.0000 mg | ORAL_CAPSULE | Freq: Three times a day (TID) | ORAL | 0 refills | Status: DC
Start: 2020-01-03 — End: 2021-05-09

## 2020-01-03 MED ORDER — ALBUTEROL SULFATE HFA 108 (90 BASE) MCG/ACT IN AERS
2.0000 | INHALATION_SPRAY | Freq: Four times a day (QID) | RESPIRATORY_TRACT | 0 refills | Status: DC | PRN
Start: 2020-01-03 — End: 2022-04-29

## 2020-01-03 MED ORDER — AMOXICILLIN-POT CLAVULANATE 875-125 MG PO TABS: 1.0000 | ORAL_TABLET | Freq: Two times a day (BID) | ORAL | 0 refills | Status: DC

## 2020-01-03 MED ORDER — DOXYCYCLINE HYCLATE 100 MG PO CAPS
100.0000 mg | ORAL_CAPSULE | Freq: Two times a day (BID) | ORAL | 0 refills | Status: AC
Start: 2020-01-03 — End: 2020-01-10

## 2020-01-03 MED ORDER — AMOXICILLIN-POT CLAVULANATE 875-125 MG PO TABS
1.0000 | ORAL_TABLET | Freq: Two times a day (BID) | ORAL | 0 refills | Status: AC
Start: 2020-01-03 — End: 2020-01-10

## 2020-01-03 MED ORDER — ALBUTEROL SULFATE HFA 108 (90 BASE) MCG/ACT IN AERS: 2.0000 | INHALATION_SPRAY | Freq: Four times a day (QID) | RESPIRATORY_TRACT | 0 refills | Status: DC | PRN

## 2020-01-03 MED ORDER — PREDNISONE 10 MG PO TABS: 40.0000 mg | ORAL_TABLET | Freq: Every day | ORAL | 0 refills | Status: DC

## 2020-01-03 NOTE — ED Triage Notes (Signed)
Pt c/o nigth sweats, diarrhea, productive cough w/green phlegm, loss of appetitex1 wk. Pt has non labored breathing. Pt has coarse lungs through out.

## 2020-01-03 NOTE — Discharge Instructions (Addendum)
It appears there is a pneumonia on her chest x-ray I have sent to antibiotics -Doxycycline, twice a day for 7 days -Augmentin twice a day for 7 days  I also want you to take prednisone 40 mg in the morning for 5 days  Utilize the albuterol inhaler for shortness of breath.  If your symptoms are worsening, you experience chest pain or worsening shortness of breath need to report to the emergency department  Call the internal medicine center to establish care if you do not already have a primary care, for reevaluation and further assessment of likely lung disease.  If your Covid-19 test is positive, you will receive a phone call from [facility] regarding your results. Negative test results are not called. Both positive and negative results area always visible on MyChart. If you do not have a MyChart account, sign up instructions are in your discharge papers.   Persons who are directed to care for themselves at home may discontinue isolation under the following conditions:   At least 10 days have passed since symptom onset and  At least 24 hours have passed without running a fever (this means without the use of fever-reducing medications) and  Other symptoms have improved.  Persons infected with COVID-19 who never develop symptoms may discontinue isolation and other precautions 10 days after the date of their first positive COVID-19 test.

## 2020-01-03 NOTE — ED Provider Notes (Signed)
[place]    CSN: [id number] Arrival date & time: 01/03/20  1030      History   Chief Complaint Chief Complaint  Patient presents with  . COVID symptoms    HPI [facility] is a 54 y.o. male.   Patient reports for 1 week history of nighttime sweating, productive cough and shortness of breath.  He reports cough is significantly worse at night.  He is coughing up green phlegm.  He reports when he is having coughing fits he feels a little short of breath.  Denies blood in the sputum.  Describes the cough as a hacking cough.  He reports he will wake up with his 'sheets are soaked with sweat'.  He reports the cough is present  occasionally during the day.  He reports he feels mostly well during the day but symptoms are primarily present at night.  He denies significant shortness of breath during the day.  Denies chest pain.  He has had no vomiting or choking lately.  He reports he has also had some congestion and runny nose.  Denies sore throat.  Denies headache.  He reports a diminished appetite over the last week and has not eaten much food in the last 2 to 3 days.  He reports he believes he has lost weight over the last week, and states that his weight is normally " 138", however he states his appetite may have been diminished for longer than this.  He has been taking Mucinex for the cough and symptoms.  Patient is a current everyday smoker up to a pack a day.  He denies having a cough at baseline.  He has not seen a provider in some time and does not have a primary care provider.  He takes a multivitamin every day.  He reports he lives with a roommate at home.  He does report he was around somebody who had cold-like symptoms however they had their Covid vaccines.  He denies other sick contacts.     Past Medical History:  Diagnosis Date  . Left groin hernia   . Medical history non-contributory     There are no problems to display for this patient.   History  reviewed. No pertinent surgical history.     Home Medications    Prior to Admission medications   Medication Sig Start Date End Date Taking? Authorizing Provider  guaiFENesin (MUCINEX) 600 MG 12 hr tablet Take 600 mg by mouth 2 (two) times daily.   Yes [provider]  Multiple Vitamin (MULTIVITAMIN) tablet Take 1 tablet by mouth daily.   Yes [provider]  acetaminophen (TYLENOL) 500 MG tablet Take 1 tablet (500 mg total) by mouth every 6 (six) hours as needed. 01/13/18   Fawze, Mina A, PA-C  albuterol (VENTOLIN HFA) 108 (90 Base) MCG/ACT inhaler Inhale 2 puffs into the lungs every 6 (six) hours as needed for wheezing or shortness of breath. 01/03/20   Emylia Latella, Marguerita Beards, PA-C  amoxicillin-clavulanate (AUGMENTIN) 875-125 MG tablet Take 1 tablet by mouth 2 (two) times daily for 7 days. 01/03/20 01/10/20  Erubiel Manasco, Marguerita Beards, PA-C  benzonatate (TESSALON) 100 MG capsule Take 1 capsule (100 mg total) by mouth every 8 (eight) hours. 01/03/20   Ananias Kolander, Marguerita Beards, PA-C  cyclobenzaprine (FLEXERIL) 10 MG tablet Take 1 tablet (10 mg total) by mouth 2 (two) times daily as needed for muscle spasms. Patient not taking: Reported on 03/26/2019 01/13/18   Rodell Perna A, PA-C  doxycycline (  VIBRAMYCIN) 100 MG capsule Take 1 capsule (100 mg total) by mouth 2 (two) times daily for 7 days. 01/03/20 01/10/20  Lacee Grey, Veryl Speak, PA-C  predniSONE (DELTASONE) 10 MG tablet Take 4 tablets (40 mg total) by mouth daily for 5 days. 01/03/20 01/08/20  Charlett Merkle, Veryl Speak, PA-C  traMADol Janean Sark) 50 MG tablet Take one every 8 hours for pain not helped by tylenol Patient not taking: Reported on 03/26/2019 07/27/15   Bethann Berkshire, MD    Family History Family History  Problem Relation Age of Onset  . Healthy Mother   . Healthy Father     Social History Social History   Tobacco Use  . Smoking status: Current Every Day Smoker    Packs/day: 1.00    Types: Cigarettes  . Smokeless tobacco: Never Used  Substance Use Topics  . Alcohol  use: Yes    Alcohol/week: 7.0 standard drinks    Types: 7 Cans of beer per week    Comment: 42oz  . Drug use: Yes    Types: Marijuana     Allergies   Shrimp [shellfish allergy] and Aleve [naproxen]   Review of Systems Review of Systems   Physical Exam Triage Vital Signs ED Triage Vitals  Enc Vitals Group     BP      Pulse      Resp      Temp      Temp src      SpO2      Weight      Height      Head Circumference      Peak Flow      Pain Score      Pain Loc      Pain Edu?      Excl. in GC?    No data found.  Updated Vital Signs BP 112/85   Pulse 85   Temp 98.2 F (36.8 C) (Oral)   Resp 16   Ht 5\' 8"  (1.727 m)   Wt 135 lb (61.2 kg)   SpO2 96%   BMI 20.53 kg/m   Visual Acuity Right Eye Distance:   Left Eye Distance:   Bilateral Distance:    Right Eye Near:   Left Eye Near:    Bilateral Near:     Physical Exam Vitals and nursing note reviewed.  Constitutional:      General: He is not in acute distress.    Appearance: He is well-developed. He is not ill-appearing or diaphoretic.     Comments: Very thin male.  HENT:     Head: Normocephalic and atraumatic.     Nose: Rhinorrhea present.     Mouth/Throat:     Mouth: Mucous membranes are moist.     Comments: There is mild postnasal drip Eyes:     Conjunctiva/sclera: Conjunctivae normal.     Pupils: Pupils are equal, round, and reactive to light.  Cardiovascular:     Rate and Rhythm: Normal rate and regular rhythm.     Heart sounds: No murmur.  Pulmonary:     Effort: Pulmonary effort is normal. No respiratory distress.     Comments: Relatively coarse sounds throughout however in the left lower fields there is rhonchi and expiratory wheezing.  Patient is not in respiratory distress, speaking in full sentences without issues.  There is bilateral expansion chest with inspiration.  Ribs are visible on chest inspection throughout the thorax. Abdominal:     Palpations: Abdomen is soft.      Tenderness:  There is no abdominal tenderness.  Musculoskeletal:     Cervical back: Neck supple.     Right lower leg: No edema.     Left lower leg: No edema.  Skin:    General: Skin is warm and dry.     Findings: No rash.  Neurological:     General: No focal deficit present.     Mental Status: He is alert and oriented to person, place, and time.      UC Treatments / Results  Labs (all labs ordered are listed, but only abnormal results are displayed) Labs Reviewed  SARS CORONAVIRUS 2 (TAT 6-24 HRS)    EKG   Radiology DG Chest 2 View  Result Date: 01/03/2020 CLINICAL DATA:  Cough productive of green sputum, shortness of breath, abnormal breath sounds lower LEFT lungs, sweating at night, diarrhea, fever, hard coughing, sick for 1.5 weeks, smoker EXAM: CHEST - 2 VIEW COMPARISON:  None FINDINGS: Normal heart size, mediastinal contours, and pulmonary vascularity. Lungs mildly hyperinflated with central peribronchial thickening. Minimal patchy infiltrate RIGHT upper lobe consistent with pneumonia. Remaining lungs clear. No pleural effusion or pneumothorax. Osseous structures unremarkable. IMPRESSION: Bronchitic changes with minimal RIGHT upper lobe infiltrate consistent with pneumonia. Electronically Signed   By: Ulyses Southward M.D.   On: 01/03/2020 13:22    Procedures Procedures (including critical care time)  Medications Ordered in UC Medications - No data to display  Initial Impression / Assessment and Plan / UC Course  I have reviewed the triage vital signs and the nursing notes.  Pertinent labs & imaging results that were available during my care of the patient were reviewed by me and considered in my medical decision making (see chart for details).     #Community-acquired pneumonia #COPD-clinical and x-ray evidence Patient is a 54 year old presenting with likely COPD and a right-sided pneumonia.  He is mostly well-appearing in clinic today and afebrile.  Saturating well at  96%.  Chest x-ray did show minimal right upper lobe pneumonia.  X-ray also shows significant evidence of COPD, and given long smoking history and clinical presentation today, it is very likely this patient has COPD.  Patient states he has concerns about how he will pay for his medicines as he has limited money.  Took extensive time to find the cheapest medication options and supply good Rx coupons.  Discussed that if he had to choose between the medicines that he should fill the antibiotics and take these, however it would be preferable that he filled all of these medicines.  I also stressed that it is very important for him to establish care with a primary care provider and this resource was given.  I discussed the findings of the chest x-ray and likely that he has COPD.  We discussed return emergency department precautions.  Covid PCR was sent.  Patient verbalized understanding. Final Clinical Impressions(s) / UC Diagnoses   Final diagnoses:  Community acquired pneumonia of right upper lobe of lung  Chronic obstructive pulmonary disease, unspecified COPD type (HCC)     Discharge Instructions     It appears there is a pneumonia on her chest x-ray I have sent to antibiotics -Doxycycline, twice a day for 7 days -Augmentin twice a day for 7 days  I also want you to take prednisone 40 mg in the morning for 5 days  Utilize the albuterol inhaler for shortness of breath.  If your symptoms are worsening, you experience chest pain or worsening shortness of breath need to report  to the emergency department  Call the internal medicine center to establish care if you do not already have a primary care, for reevaluation and further assessment of likely lung disease.  If your Covid-19 test is positive, you will receive a phone call from [facility] regarding your results. Negative test results are not called. Both positive and negative results area always visible on MyChart. If you do not have a MyChart  account, sign up instructions are in your discharge papers.   Persons who are directed to care for themselves at home may discontinue isolation under the following conditions:  . At least 10 days have passed since symptom onset and . At least 24 hours have passed without running a fever (this means without the use of fever-reducing medications) and . Other symptoms have improved.  Persons infected with COVID-19 who never develop symptoms may discontinue isolation and other precautions 10 days after the date of their first positive COVID-19 test.      ED Prescriptions    Medication Sig Dispense Auth. Provider   amoxicillin-clavulanate (AUGMENTIN) 875-125 MG tablet  (Status: Discontinued) Take 1 tablet by mouth 2 (two) times daily for 7 days. 14 tablet Deshawnda Acrey, Veryl Speak, PA-C   doxycycline (VIBRAMYCIN) 100 MG capsule  (Status: Discontinued) Take 1 capsule (100 mg total) by mouth 2 (two) times daily for 7 days. 14 capsule Kateleen Encarnacion, Veryl Speak, PA-C   predniSONE (DELTASONE) 10 MG tablet  (Status: Discontinued) Take 4 tablets (40 mg total) by mouth daily for 5 days. 20 tablet Dietrich Ke, Veryl Speak, PA-C   albuterol (VENTOLIN HFA) 108 (90 Base) MCG/ACT inhaler  (Status: Discontinued) Inhale 2 puffs into the lungs every 6 (six) hours as needed for wheezing or shortness of breath. 8 g Junia Nygren, Veryl Speak, PA-C   amoxicillin-clavulanate (AUGMENTIN) 875-125 MG tablet Take 1 tablet by mouth 2 (two) times daily for 7 days. 14 tablet Nicha Hemann, Veryl Speak, PA-C   albuterol (VENTOLIN HFA) 108 (90 Base) MCG/ACT inhaler Inhale 2 puffs into the lungs every 6 (six) hours as needed for wheezing or shortness of breath. 8 g Madalynne Gutmann, Veryl Speak, PA-C   doxycycline (VIBRAMYCIN) 100 MG capsule Take 1 capsule (100 mg total) by mouth 2 (two) times daily for 7 days. 14 capsule Abbrielle Batts, Veryl Speak, PA-C   predniSONE (DELTASONE) 10 MG tablet Take 4 tablets (40 mg total) by mouth daily for 5 days. 20 tablet Loukas Antonson, Veryl Speak, PA-C   benzonatate (TESSALON) 100 MG  capsule Take 1 capsule (100 mg total) by mouth every 8 (eight) hours. 21 capsule Grecia Lynk, Veryl Speak, PA-C     PDMP not reviewed this encounter.   Hermelinda Medicus, PA-C 01/03/20 [id number]

## 2020-01-18 ENCOUNTER — Ambulatory Visit: Payer: Self-pay

## 2020-01-21 ENCOUNTER — Ambulatory Visit: Payer: Self-pay | Attending: Internal Medicine

## 2020-01-21 DIAGNOSIS — Z23 Encounter for immunization: Secondary | ICD-10-CM

## 2020-01-21 NOTE — Progress Notes (Signed)
   Covid-19 Vaccination Clinic  Name:  John Hopkins    MRN: [id number] DOB: 09-23-1965  01/21/2020  [name] was observed post Covid-19 immunization for 15 minutes without incident. He was provided with Vaccine Information Sheet and instruction to access the V-Safe system.   [name] was instructed to call 911 with any severe reactions post vaccine: Marland Kitchen Difficulty breathing  . Swelling of face and throat  . A fast heartbeat  . A bad rash all over body  . Dizziness and weakness   Immunizations Administered    Name Date Dose VIS Date Route   Pfizer COVID-19 Vaccine 01/21/2020 11:22 AM 0.3 mL 09/23/2018 Intramuscular   Manufacturer: [employer], [employer]   Lot: [id number]   NDC: [address]-4

## 2020-02-11 ENCOUNTER — Ambulatory Visit: Payer: Self-pay | Attending: Internal Medicine

## 2020-02-11 DIAGNOSIS — Z23 Encounter for immunization: Secondary | ICD-10-CM

## 2020-02-11 NOTE — Progress Notes (Signed)
   Covid-19 Vaccination Clinic  Name:  [facility]    MRN: [id number] DOB: 03-19-1966  02/11/2020  [name] was observed post Covid-19 immunization for 15 minutes without incident. He was provided with Vaccine Information Sheet and instruction to access the V-Safe system.   [name] was instructed to call 911 with any severe reactions post vaccine: Marland Kitchen Difficulty breathing  . Swelling of face and throat  . A fast heartbeat  . A bad rash all over body  . Dizziness and weakness   Immunizations Administered    Name Date Dose VIS Date Route   Pfizer COVID-19 Vaccine 02/11/2020 11:48 AM 0.3 mL 09/23/2018 Intramuscular   Manufacturer: [employer], [employer]   Lot: [id number]   NDC: [address]-4

## 2020-05-05 ENCOUNTER — Other Ambulatory Visit: Payer: Self-pay

## 2020-05-05 ENCOUNTER — Emergency Department (HOSPITAL_COMMUNITY): Payer: Self-pay

## 2020-05-05 ENCOUNTER — Emergency Department (HOSPITAL_COMMUNITY)
Admission: EM | Admit: 2020-05-05 | Discharge: 2020-05-05 | Disposition: A | Discharge status: Home / Self Care | Payer: Self-pay | Attending: Emergency Medicine | Admitting: Emergency Medicine

## 2020-05-05 ENCOUNTER — Encounter (HOSPITAL_COMMUNITY): Payer: Self-pay

## 2020-05-05 DIAGNOSIS — R112 Nausea with vomiting, unspecified: Secondary | ICD-10-CM

## 2020-05-05 DIAGNOSIS — R0789 Other chest pain: Secondary | ICD-10-CM | POA: Insufficient documentation

## 2020-05-05 DIAGNOSIS — F17210 Nicotine dependence, cigarettes, uncomplicated: Secondary | ICD-10-CM | POA: Insufficient documentation

## 2020-05-05 LAB — TROPONIN I (HIGH SENSITIVITY)
Troponin I (High Sensitivity): 5 ng/L (ref <18)
Troponin I (High Sensitivity): 4 ng/L (ref <18)

## 2020-05-05 LAB — COMPREHENSIVE METABOLIC PANEL
ALT: 17 U/L (ref 0–44)
AST: 26 U/L (ref 15–41)
Albumin: 4.6 g/dL (ref 3.5–5.0)
Alkaline Phosphatase: 50 U/L (ref 38–126)
Anion gap: 12 (ref 5–15)
BUN: 17 mg/dL (ref 6–20)
CO2: 23 mmol/L (ref 22–32)
Calcium: 9.8 mg/dL (ref 8.9–10.3)
Chloride: 101 mmol/L (ref 98–111)
Creatinine, Ser: 1.16 mg/dL (ref 0.61–1.24)
GFR calc non Af Amer: >60 mL/min (ref >60)
Glucose, Bld: 106 mg/dL — ABNORMAL HIGH (ref 70–99)
Potassium: 4.8 mmol/L (ref 3.5–5.1)
Sodium: 136 mmol/L (ref 135–145)
Total Bilirubin: 1 mg/dL (ref 0.3–1.2)
Total Protein: 8 g/dL (ref 6.5–8.1)

## 2020-05-05 LAB — CBC
HCT: 42 % (ref 39.0–52.0)
Hemoglobin: 14.3 g/dL (ref 13.0–17.0)
MCH: 32.4 pg (ref 26.0–34.0)
MCHC: 34 g/dL (ref 30.0–36.0)
MCV: 95.2 fL (ref 80.0–100.0)
Platelets: 253 10*3/uL (ref 150–400)
RBC: 4.41 MIL/uL (ref 4.22–5.81)
RDW: 13.2 % (ref 11.5–15.5)
WBC: 6.3 10*3/uL (ref 4.0–10.5)
nRBC: 0 % (ref 0.0–0.2)

## 2020-05-05 LAB — LIPASE, BLOOD: Lipase: 21 U/L (ref 11–51)

## 2020-05-05 MED ORDER — CYCLOBENZAPRINE HCL 10 MG PO TABS: 5.0000 mg | ORAL_TABLET | Freq: Once | ORAL | Status: DC

## 2020-05-05 MED ORDER — ONDANSETRON 4 MG PO TBDP
ORAL_TABLET | ORAL | 0 refills | Status: DC
Start: 2020-05-05 — End: 2021-05-09

## 2020-05-05 MED ORDER — DIAZEPAM 5 MG/ML IJ SOLN
2.5000 mg | Freq: Once | INTRAMUSCULAR | Status: AC
  Administered 2020-05-05: 2.5 mg via INTRAVENOUS
  Filled 2020-05-05: qty 2

## 2020-05-05 MED ORDER — ONDANSETRON HCL 4 MG/2ML IJ SOLN
4.0000 mg | Freq: Once | INTRAMUSCULAR | Status: AC
  Administered 2020-05-05: 4 mg via INTRAVENOUS
  Filled 2020-05-05: qty 2

## 2020-05-05 MED ORDER — ACETAMINOPHEN 500 MG PO TABS
1000.0000 mg | ORAL_TABLET | Freq: Once | ORAL | Status: AC
  Administered 2020-05-05: 1000 mg via ORAL
  Filled 2020-05-05: qty 2

## 2020-05-05 MED ORDER — CYCLOBENZAPRINE HCL 5 MG PO TABS
5.0000 mg | ORAL_TABLET | Freq: Three times a day (TID) | ORAL | 0 refills | Status: DC | PRN
Start: 2020-05-05 — End: 2021-05-09

## 2020-05-05 MED ORDER — SODIUM CHLORIDE 0.9 % IV BOLUS
1000.0000 mL | Freq: Once | INTRAVENOUS | Status: AC
  Administered 2020-05-05: 1000 mL via INTRAVENOUS

## 2020-05-05 NOTE — ED Provider Notes (Signed)
[facility] EMERGENCY DEPARTMENT Provider Note   CSN: [id number] Arrival date & time: 05/05/20  1429     History Chief Complaint  Patient presents with  . Chest Pain  . Numbness    ABBY STINES is a 54 y.o. male here presenting with chest pain.  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  He states that it is worse with movement and worse when he takes a deep breath.  Denies any shortness of breath.  He states that the pain radiated down his left arm.  He states that for the last several days he has been having some nausea vomiting as well.  He states that he received a Covid vaccine he has no Covid exposure recently tested negative for Covid.  Patient denies any history of CAD.  Patient states that he is a smoker and may had COPD in the past.  The history is provided by the patient.       Past Medical History:  Diagnosis Date  . Left groin hernia   . Medical history non-contributory     There are no problems to display for this patient.   History reviewed. No pertinent surgical history.     Family History  Problem Relation Age of Onset  . Healthy Mother   . Healthy Father     Social History   Tobacco Use  . Smoking status: Current Every Day Smoker    Packs/day: 1.00    Types: Cigarettes  . Smokeless tobacco: Never Used  Substance Use Topics  . Alcohol use: Yes    Alcohol/week: 7.0 standard drinks    Types: 7 Cans of beer per week    Comment: 42oz  . Drug use: Yes    Types: Marijuana    Home Medications Prior to Admission medications   Medication Sig Start Date End Date Taking? Authorizing Provider  acetaminophen (TYLENOL) 500 MG tablet Take 1 tablet (500 mg total) by mouth every 6 (six) hours as needed. 01/13/18   Fawze, Mina A, PA-C  albuterol (VENTOLIN HFA) 108 (90 Base) MCG/ACT inhaler Inhale 2 puffs into the lungs every 6 (six) hours as needed for wheezing or shortness  of breath. 01/03/20   Darr, Veryl Speak, PA-C  benzonatate (TESSALON) 100 MG capsule Take 1 capsule (100 mg total) by mouth every 8 (eight) hours. 01/03/20   Darr, Veryl Speak, PA-C  cyclobenzaprine (FLEXERIL) 10 MG tablet Take 1 tablet (10 mg total) by mouth 2 (two) times daily as needed for muscle spasms. Patient not taking: Reported on 03/26/2019 01/13/18   Michela Pitcher A, PA-C  guaiFENesin (MUCINEX) 600 MG 12 hr tablet Take 600 mg by mouth 2 (two) times daily.    [provider]  Multiple Vitamin (MULTIVITAMIN) tablet Take 1 tablet by mouth daily.    [provider]  traMADol Janean Sark) 50 MG tablet Take one every 8 hours for pain not helped by tylenol Patient not taking: Reported on 03/26/2019 07/27/15   Bethann Berkshire, MD    Allergies    Shrimp [shellfish allergy] and Aleve [naproxen]  Review of Systems   Review of Systems  Cardiovascular: Positive for chest pain.  All other systems reviewed and are negative.   Physical Exam Updated Vital Signs BP 131/84   Pulse 92   Temp 100.2 F (37.9 C) (Oral)   Resp 18   Ht 5\' 8"  (1.727 m)   Wt 61.2 kg  SpO2 100%   BMI 20.53 kg/m   Physical Exam Vitals and nursing note reviewed.  Constitutional:      Comments: Uncomfortable   HENT:     Head: Normocephalic.  Eyes:     Pupils: Pupils are equal, round, and reactive to light.  Cardiovascular:     Rate and Rhythm: Normal rate and regular rhythm.     Heart sounds: Normal heart sounds.  Pulmonary:     Effort: Pulmonary effort is normal.     Comments: Reproducible L chest wall tenderness  Abdominal:     General: Bowel sounds are normal.     Palpations: Abdomen is soft.  Musculoskeletal:        General: Normal range of motion.     Cervical back: Normal range of motion.  Skin:    General: Skin is warm.  Neurological:     General: No focal deficit present.     Mental Status: He is alert and oriented to person, place, and time.  Psychiatric:        Mood and Affect: Mood  normal.        Behavior: Behavior normal.     ED Results / Procedures / Treatments   Labs (all labs ordered are listed, but only abnormal results are displayed) Labs Reviewed  COMPREHENSIVE METABOLIC PANEL - Abnormal; Notable for the following components:      Result Value   Glucose, Bld 106 (*)    All other components within normal limits  CBC  LIPASE, BLOOD  TROPONIN I (HIGH SENSITIVITY)    EKG EKG Interpretation  Date/Time:  Thursday May 05 2020 14:35:42 EDT Ventricular Rate:  97 PR Interval:  170 QRS Duration: 90 QT Interval:  338 QTC Calculation: 429 R Axis:   60 Text Interpretation: Normal sinus rhythm Right atrial enlargement Septal infarct , age undetermined Abnormal ECG Confirmed by Marianna Fuss ([id number]) on 05/05/2020 2:40:09 PM   Radiology DG Chest 2 View  Result Date: 05/05/2020 CLINICAL DATA:  Chest pain. EXAM: CHEST - 2 VIEW COMPARISON:  01/03/2020. FINDINGS: Mediastinum and hilar structures normal. Heart size normal. Interim clearing of right mid lung axis/infiltrate. Lungs are clear. No pleural effusion or pneumothorax. No acute bony abnormality. IMPRESSION: Interim clearing of right mid lung atelectasis/infiltrate noted on chest x-ray of 01/03/2020. No acute cardiopulmonary disease identified. Electronically Signed   By: Maisie Fus  Register   On: 05/05/2020 15:02    Procedures Procedures (including critical care time)  Medications Ordered in ED Medications  sodium chloride 0.9 % bolus 1,000 mL (has no administration in time range)  acetaminophen (TYLENOL) tablet 1,000 mg (has no administration in time range)  ondansetron (ZOFRAN) injection 4 mg (has no administration in time range)  diazepam (VALIUM) injection 2.5 mg (has no administration in time range)    ED Course  I have reviewed the triage vital signs and the nursing notes.  Pertinent labs & imaging results that were available during my care of the patient were reviewed by me and considered  in my medical decision making (see chart for details).    MDM Rules/Calculators/A&P                         KANON NOVOSEL is a 54 y.o. male here with chest pain.  Left-sided chest pain is worse with movement.  I suspect MSK in origin.  He also has low-grade temperature and some vomiting recently.  I consider gastroenteritis as well.  Also consider Covid  but he is vaccinated and states that he has a negative Covid test recently.  Plan to get CBC, CMP, lipase, troponin.  Will hydrate patient and reassess.  6:55 PM Trop neg x 2.  Labs unremarkable and chest x-ray showed no infiltrate.  Pain improved with Flexeril.  I think likely muscle strain.  Will discharge home with Flexeril as needed and Zofran for nausea.   Final Clinical Impression(s) / ED Diagnoses Final diagnoses:  None    Rx / DC Orders ED Discharge Orders    None       Charlynne Pander, MD 05/05/20 [phone]

## 2020-05-05 NOTE — ED Triage Notes (Signed)
Pt reports left sided chest pain and left hand tingling that started around 1030 this morning. Pt also felt sick to his stomach just prior to chest pain starting.

## 2020-05-05 NOTE — ED Notes (Signed)
Pt dc by MD and is provided with DC instructions and follow up care, Pt is out of there ED ambulatory

## 2020-05-05 NOTE — Discharge Instructions (Signed)
Stay hydrated and take Zofran for nausea  Take Flexeril for muscle spasms.  Rest tomorrow.  See your doctor for follow  Return to ER if you have worse chest pain, vomiting.

## 2020-05-05 NOTE — ED Notes (Signed)
Pt tolerating PO fluids

## 2020-08-22 ENCOUNTER — Other Ambulatory Visit: Payer: Self-pay

## 2021-01-13 ENCOUNTER — Ambulatory Visit (HOSPITAL_COMMUNITY)
Admission: EM | Admit: 2021-01-13 | Discharge: 2021-01-13 | Disposition: A | Discharge status: Home / Self Care | Payer: Self-pay | Attending: Emergency Medicine | Admitting: Emergency Medicine

## 2021-01-13 ENCOUNTER — Other Ambulatory Visit: Payer: Self-pay

## 2021-01-13 ENCOUNTER — Encounter (HOSPITAL_COMMUNITY): Payer: Self-pay

## 2021-01-13 DIAGNOSIS — M5416 Radiculopathy, lumbar region: Secondary | ICD-10-CM

## 2021-01-13 MED ORDER — DEXAMETHASONE SODIUM PHOSPHATE 10 MG/ML IJ SOLN
INTRAMUSCULAR | Status: AC
  Filled 2021-01-13: qty 1

## 2021-01-13 MED ORDER — DEXAMETHASONE SODIUM PHOSPHATE 10 MG/ML IJ SOLN
10.0000 mg | Freq: Once | INTRAMUSCULAR | Status: AC
  Administered 2021-01-13: 10 mg via INTRAMUSCULAR

## 2021-01-13 MED ORDER — PREDNISONE 10 MG (21) PO TBPK
ORAL_TABLET | ORAL | 0 refills | Status: DC
Start: 2021-01-13 — End: 2021-05-09

## 2021-01-13 MED ORDER — METHOCARBAMOL 500 MG PO TABS
500.0000 mg | ORAL_TABLET | Freq: Three times a day (TID) | ORAL | 0 refills | Status: AC | PRN
Start: 2021-01-13 — End: 2021-01-18

## 2021-01-13 NOTE — ED Triage Notes (Signed)
Pt in with c/o left hip pain that started this weekend at work   States he feels like he pulled a muscle

## 2021-01-13 NOTE — Discharge Instructions (Addendum)
Take tapered steroid as directed. You can take Robaxin up to 3 times daily for the next 5 days.

## 2021-01-13 NOTE — ED Provider Notes (Signed)
[facility]  ____________________________________________  Time seen: Approximately 1:27 PM  I have reviewed the triage vital signs and the nursing notes.   HISTORY  Chief Complaint Hip Pain   Historian Patient  HPI John Hopkins is a 55 y.o. male presents to the urgent care with left lateral hip pain that seems to radiate down the left lower extremity.  Patient has also noticed tight muscles along the left lateral hip.  No recent falls or traumas.  Patient denies bowel or bladder incontinence or saddle anesthesia.  Patient states that his current pain feels different than hernia and discomfort he is experienced in the past.  Patient has been taking ibuprofen at home.   Past Medical History:  Diagnosis Date   Left groin hernia    Medical history non-contributory      Immunizations up to date:  Yes.     Past Medical History:  Diagnosis Date   Left groin hernia    Medical history non-contributory     There are no problems to display for this patient.   History reviewed. No pertinent surgical history.  Prior to Admission medications   Medication Sig Start Date End Date Taking? Authorizing Provider  methocarbamol (ROBAXIN) 500 MG tablet Take 1 tablet (500 mg total) by mouth every 8 (eight) hours as needed for up to 5 days for muscle spasms. 01/13/21 01/18/21 Yes Pia Mau M, PA-C  predniSONE (STERAPRED UNI-PAK 21 TAB) 10 MG (21) TBPK tablet Take 6 tablets the first day, take 5 tablets the second day, take 4 tablets the third day, take 3 tablets the fourth day, take 2 tablets the fifth day, take 1 tablet the sixth day. 01/13/21  Yes Pia Mau M, PA-C  acetaminophen (TYLENOL) 500 MG tablet Take 1 tablet (500 mg total) by mouth every 6 (six) hours as needed. 01/13/18   Fawze, Mina A, PA-C  albuterol (VENTOLIN HFA) 108 (90 Base) MCG/ACT inhaler Inhale 2 puffs into the lungs every 6 (six) hours as needed for wheezing or shortness of breath. 01/03/20   Darr,  Gerilyn Pilgrim, PA-C  benzonatate (TESSALON) 100 MG capsule Take 1 capsule (100 mg total) by mouth every 8 (eight) hours. 01/03/20   Darr, Gerilyn Pilgrim, PA-C  cyclobenzaprine (FLEXERIL) 5 MG tablet Take 1 tablet (5 mg total) by mouth 3 (three) times daily as needed. 05/05/20   Charlynne Pander, MD  guaiFENesin (MUCINEX) 600 MG 12 hr tablet Take 600 mg by mouth 2 (two) times daily.    [provider]  Multiple Vitamin (MULTIVITAMIN) tablet Take 1 tablet by mouth daily.    [provider]  ondansetron (ZOFRAN ODT) 4 MG disintegrating tablet 4mg  ODT q4 hours prn nausea/vomit 05/05/20   07/05/20, MD  traMADol Charlynne Pander) 50 MG tablet Take one every 8 hours for pain not helped by tylenol Patient not taking: Reported on 03/26/2019 07/27/15   07/29/15, MD    Allergies Shrimp [shellfish allergy] and Aleve [naproxen]  Family History  Problem Relation Age of Onset   Healthy Mother    Healthy Father     Social History Social History   Tobacco Use   Smoking status: Every Day    Packs/day: 1.00    Pack years: 0.00    Types: Cigarettes   Smokeless tobacco: Never  Substance Use Topics   Alcohol use: Yes    Alcohol/week: 7.0 standard drinks    Types: 7 Cans of beer per week    Comment: 42oz   Drug use:  Yes    Types: Marijuana     Review of Systems  Constitutional: No fever/chills Eyes:  No discharge ENT: No upper respiratory complaints. Respiratory: no cough. No SOB/ use of accessory muscles to breath Gastrointestinal:   No nausea, no vomiting.  No diarrhea.  No constipation. Musculoskeletal: Patient has left lower extremity pain. Skin: Negative for rash, abrasions, lacerations, ecchymosis.   ____________________________________________   PHYSICAL EXAM:  VITAL SIGNS: ED Triage Vitals  Enc Vitals Group     BP 01/13/21 1106 121/85     Pulse Rate 01/13/21 1106 69     Resp 01/13/21 1106 19     Temp 01/13/21 1106 98.5 F (36.9 C)     Temp Source 01/13/21 1106 Oral      SpO2 01/13/21 1106 98 %     Weight --      Height --      Head Circumference --      Peak Flow --      Pain Score 01/13/21 1104 10     Pain Loc --      Pain Edu? --      Excl. in GC? --      Constitutional: Alert and oriented. Well appearing and in no acute distress. Eyes: Conjunctivae are normal. PERRL. EOMI. Head: Atraumatic. ENT: Cardiovascular: Normal rate, regular rhythm. Normal S1 and S2.  Good peripheral circulation. Respiratory: Normal respiratory effort without tachypnea or retractions. Lungs CTAB. Good air entry to the bases with no decreased or absent breath sounds Gastrointestinal: Bowel sounds x 4 quadrants. Soft and nontender to palpation. No guarding or rigidity. No distention. Musculoskeletal: Full range of motion to all extremities. No obvious deformities noted.  Positive straight leg raise test, left. Neurologic:  Normal for age. No gross focal neurologic deficits are appreciated.  Skin:  Skin is warm, dry and intact. No rash noted. Psychiatric: Mood and affect are normal for age. Speech and behavior are normal.   ____________________________________________   LABS (all labs ordered are listed, but only abnormal results are displayed)  Labs Reviewed - No data to display ____________________________________________  EKG   ____________________________________________  RADIOLOGY   No results found.  ____________________________________________    PROCEDURES  Procedure(s) performed:     Procedures     Medications  dexamethasone (DECADRON) injection 10 mg (10 mg Intramuscular Given 01/13/21 1144)     ____________________________________________   INITIAL IMPRESSION / ASSESSMENT AND PLAN / ED COURSE  Pertinent labs & imaging results that were available during my care of the patient were reviewed by me and considered in my medical decision making (see chart for details).      Assessment and plan Lumbar radiculopathy 55 year old  male presents to the urgent care with left lower extremity radiculopathy over the past 2 days.  Patient describes his pain as a burning and aching sensation that radiates along the lateral aspect of the left lower extremity.  Patient was discharged with tapered prednisone and Robaxin and was advised to follow-up with primary care as needed.  All patient questions were answered.     ____________________________________________  FINAL CLINICAL IMPRESSION(S) / ED DIAGNOSES  Final diagnoses:  Lumbar radiculopathy      NEW MEDICATIONS STARTED DURING THIS VISIT:  ED Discharge Orders          Ordered    predniSONE (STERAPRED UNI-PAK 21 TAB) 10 MG (21) TBPK tablet        01/13/21 1140    methocarbamol (ROBAXIN) 500 MG tablet  Every 8 hours  PRN        01/13/21 1140                This chart was dictated using voice recognition software/Dragon. Despite best efforts to proofread, errors can occur which can change the meaning. Any change was purely unintentional.     Orvil Feil, PA-C 01/13/21 1402

## 2021-05-09 ENCOUNTER — Ambulatory Visit (HOSPITAL_COMMUNITY)
Admission: EM | Admit: 2021-05-09 | Discharge: 2021-05-09 | Disposition: A | Discharge status: Home / Self Care | Payer: Medicaid Other | Attending: Student | Admitting: Student

## 2021-05-09 ENCOUNTER — Emergency Department (HOSPITAL_COMMUNITY): Admission: EM | Admit: 2021-05-09 | Discharge: 2021-05-09 | Discharge status: Absent without leave | Payer: Self-pay

## 2021-05-09 ENCOUNTER — Encounter (HOSPITAL_COMMUNITY): Payer: Self-pay | Admitting: Emergency Medicine

## 2021-05-09 ENCOUNTER — Other Ambulatory Visit: Payer: Self-pay

## 2021-05-09 DIAGNOSIS — M7752 Other enthesopathy of left foot: Secondary | ICD-10-CM

## 2021-05-09 DIAGNOSIS — M25571 Pain in right ankle and joints of right foot: Secondary | ICD-10-CM

## 2021-05-09 DIAGNOSIS — R0789 Other chest pain: Secondary | ICD-10-CM

## 2021-05-09 DIAGNOSIS — M19011 Primary osteoarthritis, right shoulder: Secondary | ICD-10-CM

## 2021-05-09 MED ORDER — TIZANIDINE HCL 2 MG PO TABS
2.0000 mg | ORAL_TABLET | Freq: Three times a day (TID) | ORAL | 0 refills | Status: DC | PRN
Start: 2021-05-09 — End: 2021-10-25

## 2021-05-09 NOTE — ED Provider Notes (Addendum)
[facility]    CSN: [id number] Arrival date & time: 05/09/21  0931      History   Chief Complaint Chief Complaint  Patient presents with   Arm Pain   Ankle Pain    Right    Chest Pain    Left side     HPI John Hopkins is a 55 y.o. male presenting with multiple musculoskeletal complaints.  Medical history noncontributory.  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 -Notes left-sided chest wall pain for about 2 weeks, worse with deep inspiration and movement.  Denies shortness of breath, dizziness, weakness, chest pain at rest. -Also with right shoulder pain with abduction.  Endorses "clicking" and crepitus.  Denies sensation changes, radiation of pain.  He is right-handed -Also with right ankle pain for few weeks, describes this as pain of the inner ankle, worse with standing and ambulating.  Has not tried any medications for the symptoms.  HPI  Past Medical History:  Diagnosis Date   Left groin hernia    Medical history non-contributory     There are no problems to display for this patient.   History reviewed. No pertinent surgical history.     Home Medications    Prior to Admission medications   Medication Sig Start Date End Date Taking? Authorizing Provider  tiZANidine (ZANAFLEX) 2 MG tablet Take 1 tablet (2 mg total) by mouth every 8 (eight) hours as needed for muscle spasms. 05/09/21  Yes Rhys Martini, PA-C  acetaminophen (TYLENOL) 500 MG tablet Take 1 tablet (500 mg total) by mouth every 6 (six) hours as needed. 01/13/18   Fawze, Mina A, PA-C  albuterol (VENTOLIN HFA) 108 (90 Base) MCG/ACT inhaler Inhale 2 puffs into the lungs every 6 (six) hours as needed for wheezing or shortness of breath. 01/03/20   Darr, Gerilyn Pilgrim, PA-C  Multiple Vitamin (MULTIVITAMIN) tablet Take 1 tablet by mouth daily.    [provider]    Family History Family History  Problem Relation Age of Onset   Healthy  Mother    Healthy Father     Social History Social History   Tobacco Use   Smoking status: Every Day    Packs/day: 1.00    Types: Cigarettes   Smokeless tobacco: Never  Substance Use Topics   Alcohol use: Yes    Alcohol/week: 7.0 standard drinks    Types: 7 Cans of beer per week    Comment: 42oz   Drug use: Yes    Types: Marijuana     Allergies   Shrimp [shellfish allergy] and Aleve [naproxen]   Review of Systems Review of Systems  Constitutional:  Negative for chills, fever and unexpected weight change.  Respiratory:  Negative for chest tightness and shortness of breath.   Cardiovascular:  Negative for chest pain and palpitations.  Gastrointestinal:  Negative for abdominal pain, diarrhea, nausea and vomiting.  Genitourinary:  Negative for decreased urine volume, difficulty urinating and frequency.  Musculoskeletal:  Negative for arthralgias, back pain, gait problem, joint swelling, myalgias, neck pain and neck stiffness.       Chest wall pain R ankle pain R shoulder pain  Skin:  Negative for wound.  Neurological:  Negative for dizziness, tremors, seizures, syncope, facial asymmetry, speech difficulty, weakness, light-headedness, numbness and headaches.  All other systems reviewed and are negative.   Physical Exam Triage Vital Signs ED Triage Vitals  Enc Vitals Group     BP 05/09/21 1003  133/86     Pulse Rate 05/09/21 1003 71     Resp 05/09/21 1003 18     Temp 05/09/21 1003 98.6 F (37 C)     Temp src --      SpO2 05/09/21 1003 98 %     Weight --      Height --      Head Circumference --      Peak Flow --      Pain Score 05/09/21 1002 10     Pain Loc --      Pain Edu? --      Excl. in GC? --    No data found.  Updated Vital Signs BP 133/86   Pulse 71   Temp 98.6 F (37 C)   Resp 18   SpO2 98%   Visual Acuity Right Eye Distance:   Left Eye Distance:   Bilateral Distance:    Right Eye Near:   Left Eye Near:    Bilateral Near:     Physical  Exam Vitals reviewed.  Constitutional:      Appearance: Normal appearance. He is not diaphoretic.  HENT:     Head: Normocephalic and atraumatic.     Mouth/Throat:     Mouth: Mucous membranes are moist.  Eyes:     Extraocular Movements: Extraocular movements intact.     Pupils: Pupils are equal, round, and reactive to light.  Cardiovascular:     Rate and Rhythm: Normal rate and regular rhythm.     Pulses:          Radial pulses are 2+ on the right side and 2+ on the left side.     Heart sounds: Normal heart sounds.  Pulmonary:     Effort: Pulmonary effort is normal.     Breath sounds: Normal breath sounds.  Chest:     Chest wall: Tenderness present.     Comments: L anterior chest wall TTP, without effusion or skin changes. Abdominal:     Palpations: Abdomen is soft.     Tenderness: There is no abdominal tenderness. There is no guarding or rebound.  Musculoskeletal:     Right lower leg: No edema.     Left lower leg: No edema.     Comments: -Right shoulder with crepitus with abduction.  No point tenderness.  No effusion or skin changes.  No AC joint tenderness. -R ankle: TTP just distal to medial malleolar.  No malleolar tenderness or effusion.  Range of motion ankle intact but pain with plantar and dorsiflexion.  No midfoot tenderness. Sensation intact. Cap refill <2 seconds, DP 2+.    Skin:    General: Skin is warm.     Capillary Refill: Capillary refill takes less than 2 seconds.  Neurological:     General: No focal deficit present.     Mental Status: He is alert and oriented to person, place, and time.  Psychiatric:        Mood and Affect: Mood normal.        Behavior: Behavior normal.        Thought Content: Thought content normal.        Judgment: Judgment normal.     UC Treatments / Results  Labs (all labs ordered are listed, but only abnormal results are displayed) Labs Reviewed - No data to display  EKG   Radiology No results  found.  Procedures Procedures (including critical care time)  Medications Ordered in UC Medications - No data to display  Initial Impression / Assessment and Plan / UC Course  I have reviewed the triage vital signs and the nursing notes.  Pertinent labs & imaging results that were available during my care of the patient were reviewed by me and considered in my medical decision making (see chart for details).     This patient is a very pleasant 55 y.o. year old male presenting with chest wall pain; R shoulder arthritis; L ankle tendonitis. Neurovascularly intact. No trauma, but does endorse overuse from work.   Zanaflex, ASO brace. F/u with ortho if symptoms persist.   Chest pain is reproducible. EKG unchanged from 04/2020 EKG.   ED return precautions discussed. Patient verbalizes understanding and agreement.   Final Clinical Impressions(s) / UC Diagnoses   Final diagnoses:  Chest wall pain  Arthritis of right shoulder region  Acute right ankle pain     Discharge Instructions      -Start the muscle relaxer-Zanaflex (tizanidine), up to 3 times daily for muscle spasms and pain.  This can make you drowsy, so take at bedtime or when you do not need to drive or operate machinery. -Tylenol/ibuprofen -Ankle brace while pain persists -Follow-up with PCP for recheck and referral to orthopedist if symptoms persist.     ED Prescriptions     Medication Sig Dispense Auth. Provider   tiZANidine (ZANAFLEX) 2 MG tablet Take 1 tablet (2 mg total) by mouth every 8 (eight) hours as needed for muscle spasms. 21 tablet Rhys Martini, PA-C      PDMP not reviewed this encounter.   Rhys Martini, PA-C 05/09/21 1101    Rhys Martini, PA-C 05/09/21 1109

## 2021-05-09 NOTE — Discharge Instructions (Addendum)
-  Start the muscle relaxer-Zanaflex (tizanidine), up to 3 times daily for muscle spasms and pain.  This can make you drowsy, so take at bedtime or when you do not need to drive or operate machinery. -Tylenol/ibuprofen -Ankle brace while pain persists -Follow-up with PCP for recheck and referral to orthopedist if symptoms persist.

## 2021-05-09 NOTE — ED Triage Notes (Signed)
Pt is present today with left side chest pain that started Friday. Pt denies any SOB, dizziness, or blurry vision.  Pt also states that he is having pain in his right arm and ankle. Pt denies any injury Pt pain started x2 weeks ago

## 2021-10-25 ENCOUNTER — Encounter (HOSPITAL_COMMUNITY): Payer: Self-pay | Admitting: Emergency Medicine

## 2021-10-25 ENCOUNTER — Ambulatory Visit (HOSPITAL_COMMUNITY)
Admission: EM | Admit: 2021-10-25 | Discharge: 2021-10-25 | Disposition: A | Discharge status: Home / Self Care | Payer: 59 | Attending: Nurse Practitioner | Admitting: Nurse Practitioner

## 2021-10-25 ENCOUNTER — Other Ambulatory Visit: Payer: Self-pay

## 2021-10-25 DIAGNOSIS — R0789 Other chest pain: Secondary | ICD-10-CM

## 2021-10-25 MED ORDER — TIZANIDINE HCL 2 MG PO TABS: 2.0000 mg | ORAL_TABLET | Freq: Every evening | ORAL | 0 refills | Status: DC | PRN

## 2021-10-25 NOTE — Discharge Instructions (Addendum)
-   Your EKG does not show significant changes from the last EKG ?- Your pain is most likely musculoskeletal ?- You can use OTC Tylenol for the pain ?- If this does not help, you can use tizanidine at night time ?-If you start having any new symptoms like shortness of breath, arm pain, sweating, nausea or vomiting, please seek emergent care ?

## 2021-10-25 NOTE — ED Provider Notes (Signed)
?[facility] ? ? ? ?CSN: [id number] ?Arrival date & time: 10/25/21  [id number] ? ? ?  ? ?History   ?Chief Complaint ?Chief Complaint  ?Patient presents with  ? Chest Pain  ? ? ?HPI ?Beatris ShipKenneth W Feldhaus is a 56 y.o. male.  ? ?Patient reports chest pain that started this morning after waking.  He reports the pain is constant and worse when pushing on his chest or moving his left arm a certain way.  He describes it as an aching pain in his left upper chest.  It does not radiate down his arm or around his back.  It is not related to exertion.  He denies any recent trauma, fall, accident, or injury.  He reports he pushed mowed his lawn 2 days ago.  Denies any significant recent anxiety or stressors, shortness of breath, cough, nausea, vomiting, diaphoresis, heartburn, palpitations, fevers.  He has not taken anything for his pain so far. ? ? ? ? ?Past Medical History:  ?Diagnosis Date  ? Left groin hernia   ? Medical history non-contributory   ? ? ?There are no problems to display for this patient. ? ? ?History reviewed. No pertinent surgical history. ? ? ? ? ?Home Medications   ? ?Prior to Admission medications   ?Medication Sig Start Date End Date Taking? Authorizing Provider  ?acetaminophen (TYLENOL) 500 MG tablet Take 1 tablet (500 mg total) by mouth every 6 (six) hours as needed. 01/13/18   Fawze, Mina A, PA-C  ?albuterol (VENTOLIN HFA) 108 (90 Base) MCG/ACT inhaler Inhale 2 puffs into the lungs every 6 (six) hours as needed for wheezing or shortness of breath. 01/03/20   Darr, Gerilyn PilgrimJacob, PA-C  ?Multiple Vitamin (MULTIVITAMIN) tablet Take 1 tablet by mouth daily.    [provider]  ?tiZANidine (ZANAFLEX) 2 MG tablet Take 1 tablet (2 mg total) by mouth at bedtime as needed for muscle spasms. Do not take while driving or operating heavy machinery 10/25/21   Valentino NoseMartinez, [place] Devine A, NP  ? ? ?Family History ?Family History  ?Problem Relation Age of Onset  ? Healthy Mother   ? Healthy Father   ? ? ?Social History ?Social  History  ? ?Tobacco Use  ? Smoking status: Every Day  ?  Packs/day: 1.00  ?  Types: Cigarettes  ? Smokeless tobacco: Never  ?Substance Use Topics  ? Alcohol use: Yes  ?  Alcohol/week: 7.0 standard drinks  ?  Types: 7 Cans of beer per week  ?  Comment: 42oz  ? Drug use: Yes  ?  Types: Marijuana  ? ? ? ?Allergies   ?Shrimp [shellfish allergy] and Aleve [naproxen] ? ? ?Review of Systems ?Review of Systems ?Per HPI ? ?Physical Exam ?Triage Vital Signs ?ED Triage Vitals  ?Enc Vitals Group  ?   BP 10/25/21 0946 122/85  ?   Pulse Rate 10/25/21 0946 64  ?   Resp 10/25/21 0946 17  ?   Temp 10/25/21 0946 98 ?F (36.7 ?C)  ?   Temp Source 10/25/21 0946 Oral  ?   SpO2 10/25/21 0946 100 %  ?   Weight --   ?   Height --   ?   Head Circumference --   ?   Peak Flow --   ?   Pain Score 10/25/21 0945 8  ?   Pain Loc --   ?   Pain Edu? --   ?   Excl. in GC? --   ? ?No data found. ? ?  Updated Vital Signs ?BP 122/85   Pulse 64   Temp 98 ?F (36.7 ?C) (Oral)   Resp 17   SpO2 100%  ? ?Visual Acuity ?Right Eye Distance:   ?Left Eye Distance:   ?Bilateral Distance:   ? ?Right Eye Near:   ?Left Eye Near:    ?Bilateral Near:    ? ?Physical Exam ?Vitals and nursing note reviewed.  ?Constitutional:   ?   General: He is not in acute distress. ?   Appearance: He is well-developed. He is not toxic-appearing.  ?HENT:  ?   Head: Normocephalic and atraumatic.  ?Eyes:  ?   Extraocular Movements: Extraocular movements intact.  ?Cardiovascular:  ?   Rate and Rhythm: Normal rate and regular rhythm.  ?Pulmonary:  ?   Effort: Pulmonary effort is normal. No tachypnea or respiratory distress.  ?   Breath sounds: Normal breath sounds.  ?Chest:  ?   Chest wall: Tenderness present.  ? ? ?   Comments: TTP in the area marked ?Skin: ?   General: Skin is warm and dry.  ?   Capillary Refill: Capillary refill takes less than 2 seconds.  ?   Coloration: Skin is not cyanotic or pale.  ?   Findings: No erythema.  ?Neurological:  ?   Mental Status: He is alert and  oriented to person, place, and time.  ?Psychiatric:     ?   Mood and Affect: Mood normal.     ?   Behavior: Behavior normal.  ? ? ? ?UC Treatments / Results  ?Labs ?(all labs ordered are listed, but only abnormal results are displayed) ?Labs Reviewed - No data to display ? ?EKG ? ? ?Radiology ?No results found. ? ?Procedures ?Procedures (including critical care time) ? ?Medications Ordered in UC ?Medications - No data to display ? ?Initial Impression / Assessment and Plan / UC Course  ?I have reviewed the triage vital signs and the nursing notes. ? ?Pertinent labs & imaging results that were available during my care of the patient were reviewed by me and considered in my medical decision making (see chart for details). ? ?  ?EKG today reassuring and very comparable when compared with previous EKG.  Suspect chest wall pain is musculoskeletal.  Start Tylenol as needed for pain.  If this is not relieved pain, can also use tizanidine at nighttime.  Discussed if symptoms worsen or change, he needs to be seen emergently.  The patient was given the opportunity to ask questions.  All questions answered to their satisfaction.  The patient is in agreement to this plan.  ? ?Final Clinical Impressions(s) / UC Diagnoses  ? ?Final diagnoses:  ?Chest wall pain  ? ? ? ?Discharge Instructions   ? ?  ?- Your EKG does not show significant changes from the last EKG ?- Your pain is most likely musculoskeletal ?- You can use OTC Tylenol for the pain ?- If this does not help, you can use tizanidine at night time ?-If you start having any new symptoms like shortness of breath, arm pain, sweating, nausea or vomiting, please seek emergent care ? ? ? ? ?ED Prescriptions   ? ? Medication Sig Dispense Auth. Provider  ? tiZANidine (ZANAFLEX) 2 MG tablet Take 1 tablet (2 mg total) by mouth at bedtime as needed for muscle spasms. Do not take while driving or operating heavy machinery 21 tablet Valentino Nose, NP  ? ?  ? ?PDMP not reviewed  this encounter. ?  ?Bradly Bienenstock,  Guadlupe Spanish, NP ?10/25/21 1027 ? ?

## 2021-10-25 NOTE — ED Triage Notes (Signed)
Pt is present today with left side chest pain that started this morning. Pt sx started this morning  ?

## 2021-11-12 ENCOUNTER — Other Ambulatory Visit: Payer: Self-pay | Admitting: Nurse Practitioner

## 2022-03-18 IMAGING — DX DG CHEST 2V
2 series · 2 of 2 positions shown · non-contrast
Comparison: 01/03/2020.

CLINICAL DATA: Chest pain.

EXAM:
CHEST - 2 VIEW

[w chest lat]
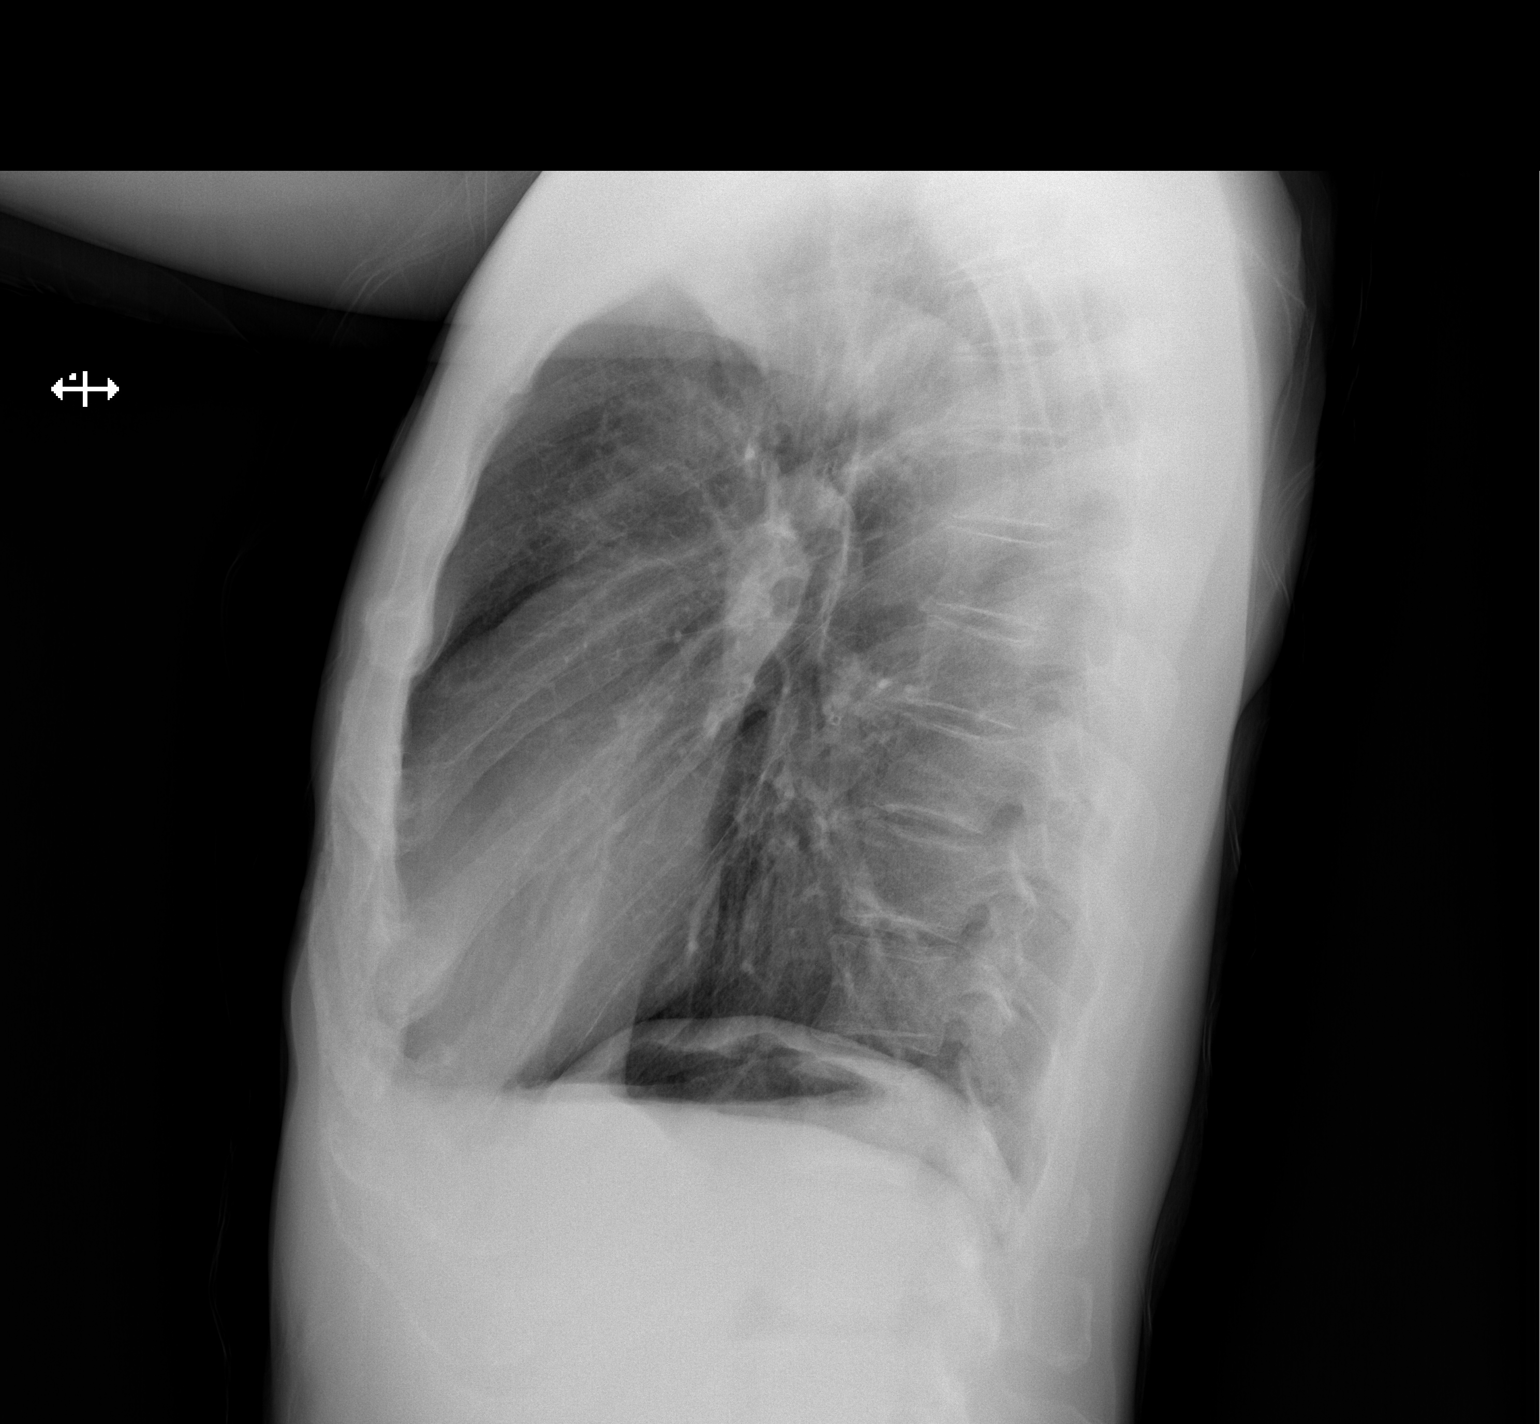

[w chest pa]
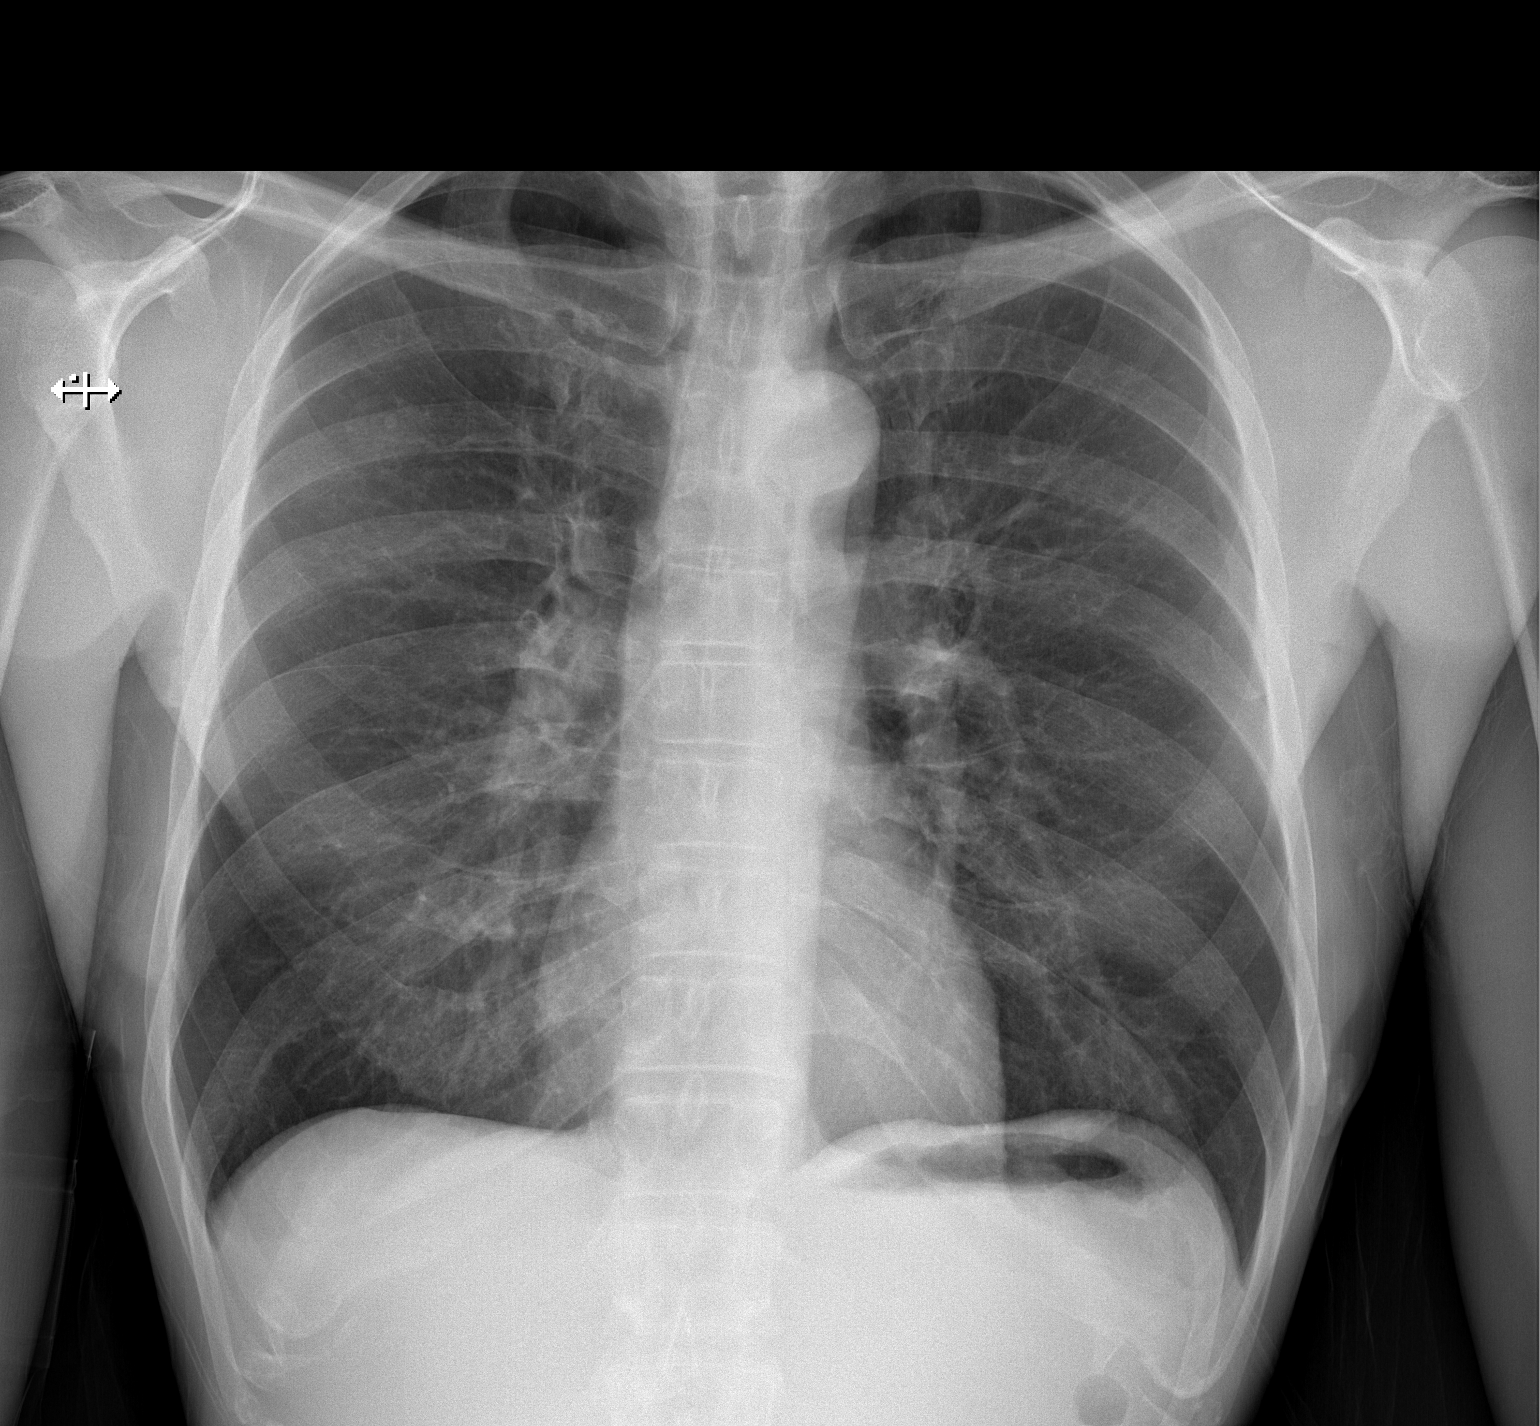

[2 of 2 positions shown; findings below may reference images not displayed]

FINDINGS: Mediastinum and hilar structures normal. Heart size normal. Interim
clearing of right mid lung axis/infiltrate. Lungs are clear. No
pleural effusion or pneumothorax. No acute bony abnormality.
IMPRESSION: Interim clearing of right mid lung atelectasis/infiltrate noted on
chest x-ray of 01/03/2020. No acute cardiopulmonary disease
identified.

## 2022-04-29 ENCOUNTER — Encounter (HOSPITAL_COMMUNITY): Payer: Self-pay | Admitting: *Deleted

## 2022-04-29 ENCOUNTER — Ambulatory Visit (HOSPITAL_COMMUNITY)
Admission: EM | Admit: 2022-04-29 | Discharge: 2022-04-29 | Disposition: A | Discharge status: Home / Self Care | Payer: Commercial Managed Care - HMO | Attending: Urgent Care | Admitting: Urgent Care

## 2022-04-29 DIAGNOSIS — U071 COVID-19: Secondary | ICD-10-CM | POA: Diagnosis present

## 2022-04-29 LAB — SARS CORONAVIRUS 2 (TAT 6-24 HRS): SARS Coronavirus 2: NEGATIVE

## 2022-04-29 NOTE — ED Triage Notes (Signed)
Pt states he cant taste or smell since yesterday. He took an at home covid test and it was positive. He is starting a new job Architectural technologist.

## 2022-04-29 NOTE — ED Provider Notes (Signed)
[place]    CSN: [id number] Arrival date & time: 04/29/22  1249      History   Chief Complaint Chief Complaint  Patient presents with   Covid Positive    HPI John Hopkins is a 56 y.o. male.   Pleasant 56 year old male presents today due to concerns of COVID.  Yesterday he started having a headache and nasal congestion.  He reports that dinner he could not taste his food, so took an at home test this morning and it was positive.  Patient is concerned because he is supposed to start a new job tomorrow.  He has had the [place] vaccination and has never had COVID to his knowledge in the past.  He denies shortness of breath cough or chest pain.  He denies fever.  He is otherwise healthy and takes no prescription medication.  He has no risk factors to warrant antiviral therapy.    Past Medical History:  Diagnosis Date   Left groin hernia    Medical history non-contributory     There are no problems to display for this patient.   History reviewed. No pertinent surgical history.     Home Medications    Prior to Admission medications   Not on File    Family History Family History  Problem Relation Age of Onset   Healthy Mother    Healthy Father     Social History Social History   Tobacco Use   Smoking status: Every Day    Packs/day: 1.00    Types: Cigarettes   Smokeless tobacco: Never  Substance Use Topics   Alcohol use: Yes    Alcohol/week: 7.0 standard drinks of alcohol    Types: 7 Cans of beer per week    Comment: 42oz   Drug use: Yes    Types: Marijuana     Allergies   Shrimp [shellfish allergy] and Aleve [naproxen]   Review of Systems Review of Systems As per HPI  Physical Exam Triage Vital Signs ED Triage Vitals  Enc Vitals Group     BP 04/29/22 1406 (!) 131/92     Pulse Rate 04/29/22 1406 75     Resp 04/29/22 1406 18     Temp 04/29/22 1406 98.1 F (36.7 C)     Temp Source 04/29/22 1406 Oral     SpO2 04/29/22 1406 100  %     Weight --      Height --      Head Circumference --      Peak Flow --      Pain Score 04/29/22 1405 0     Pain Loc --      Pain Edu? --      Excl. in [place]? --    No data found.  Updated Vital Signs BP (!) 131/92 (BP Location: Right Arm)   Pulse 75   Temp 98.1 F (36.7 C) (Oral)   Resp 18   SpO2 100%   Visual Acuity Right Eye Distance:   Left Eye Distance:   Bilateral Distance:    Right Eye Near:   Left Eye Near:    Bilateral Near:     Physical Exam Vitals and nursing note reviewed.  Constitutional:      General: He is not in acute distress.    Appearance: Normal appearance. He is well-developed and normal weight. He is not ill-appearing, toxic-appearing or diaphoretic.  HENT:     Head: Normocephalic and atraumatic.     Mouth/Throat:  Comments: MASK IN PLACE Eyes:     Conjunctiva/sclera: Conjunctivae normal.  Cardiovascular:     Rate and Rhythm: Normal rate and regular rhythm.     Pulses: Normal pulses.     Heart sounds: Normal heart sounds. No murmur heard.    No friction rub. No gallop.  Pulmonary:     Effort: Pulmonary effort is normal. No respiratory distress.     Breath sounds: Normal breath sounds. No stridor. No wheezing, rhonchi or rales.  Chest:     Chest wall: No tenderness.  Abdominal:     Palpations: Abdomen is soft.     Tenderness: There is no abdominal tenderness.  Musculoskeletal:        General: No swelling.     Cervical back: Normal range of motion and neck supple. No rigidity or tenderness.  Lymphadenopathy:     Cervical: No cervical adenopathy.  Skin:    General: Skin is warm and dry.     Capillary Refill: Capillary refill takes less than 2 seconds.     Coloration: Skin is not jaundiced.     Findings: No erythema or rash.  Neurological:     Mental Status: He is alert.  Psychiatric:        Mood and Affect: Mood normal.      UC Treatments / Results  Labs (all labs ordered are listed, but only abnormal results are  displayed) Labs Reviewed  SARS CORONAVIRUS 2 (TAT 6-24 HRS)    EKG   Radiology No results found.  Procedures Procedures (including critical care time)  Medications Ordered in UC Medications - No data to display  Initial Impression / Assessment and Plan / UC Course  I have reviewed the triage vital signs and the nursing notes.  Pertinent labs & imaging results that were available during my care of the patient were reviewed by me and considered in my medical decision making (see chart for details).     Covid 19 -PCR test performed on patient despite having a positive home test primarily due to his new job.  Patient does not meet the requirements for antiviral therapy.  Symptoms are mild, recommended over-the-counter nasal sprays or saline lavage to help with congestion. Otherwise supportive measures, rest and hydration appropriate.   Final Clinical Impressions(s) / UC Diagnoses   Final diagnoses:  [id number]     Discharge Instructions      We performed a PCR COVID test on you today primarily for your new job. Your vital signs are stable and you do not have symptoms or risk factors that would warrant antiviral therapy. You will need to quarantine at home for 4 additional days. The results of your COVID test will be available within the next 24 hours. Please drink plenty of water and rest.  Please read the attached handout on COVID-19    ED Prescriptions   None    PDMP not reviewed this encounter.   Chaney Malling, [place] 04/29/22 1958

## 2022-04-29 NOTE — Discharge Instructions (Signed)
We performed a PCR COVID test on you today primarily for your new job. Your vital signs are stable and you do not have symptoms or risk factors that would warrant antiviral therapy. You will need to quarantine at home for 4 additional days. The results of your COVID test will be available within the next 24 hours. Please drink plenty of water and rest.  Please read the attached handout on COVID-19

## 2022-04-30 ENCOUNTER — Telehealth (HOSPITAL_COMMUNITY): Payer: Self-pay | Admitting: Emergency Medicine

## 2022-04-30 NOTE — Telephone Encounter (Signed)
Patient called to review COVID results.  I reviewed with John Hopkins, APP prior to calling him back to confirm what his plan should be.  She states, even with the negative in office, if he had a true positive home test he should quarantine.  Patient updated, he states he is unsure about his home test and will treat it as a negative.  Return precautions reviewed

## 2024-01-14 ENCOUNTER — Other Ambulatory Visit (HOSPITAL_COMMUNITY): Payer: Self-pay

## 2024-01-14 ENCOUNTER — Emergency Department (HOSPITAL_COMMUNITY)

## 2024-01-14 ENCOUNTER — Emergency Department (HOSPITAL_COMMUNITY)
Admission: EM | Admit: 2024-01-14 | Discharge: 2024-01-14 | Disposition: A | Discharge status: Home / Self Care | Attending: Emergency Medicine | Admitting: Emergency Medicine

## 2024-01-14 ENCOUNTER — Other Ambulatory Visit: Payer: Self-pay

## 2024-01-14 ENCOUNTER — Encounter (HOSPITAL_COMMUNITY): Payer: Self-pay

## 2024-01-14 DIAGNOSIS — F17210 Nicotine dependence, cigarettes, uncomplicated: Secondary | ICD-10-CM | POA: Diagnosis not present

## 2024-01-14 DIAGNOSIS — R059 Cough, unspecified: Secondary | ICD-10-CM | POA: Diagnosis not present

## 2024-01-14 DIAGNOSIS — R0789 Other chest pain: Secondary | ICD-10-CM | POA: Insufficient documentation

## 2024-01-14 HISTORY — DX: Chronic obstructive pulmonary disease, unspecified: J44.9

## 2024-01-14 LAB — BASIC METABOLIC PANEL WITH GFR
Anion gap: 11 (ref 5–15)
BUN: 20 mg/dL (ref 6–20)
CO2: 19 mmol/L — ABNORMAL LOW (ref 22–32)
Calcium: 9.3 mg/dL (ref 8.9–10.3)
Chloride: 107 mmol/L (ref 98–111)
Creatinine, Ser: 1 mg/dL (ref 0.61–1.24)
GFR, Estimated: >60 mL/min (ref >60)
Glucose, Bld: 85 mg/dL (ref 70–99)
Potassium: 4.1 mmol/L (ref 3.5–5.1)
Sodium: 137 mmol/L (ref 135–145)

## 2024-01-14 LAB — TROPONIN I (HIGH SENSITIVITY)
Troponin I (High Sensitivity): 3 ng/L (ref <18)
Troponin I (High Sensitivity): 3 ng/L (ref <18)

## 2024-01-14 LAB — CBC
HCT: 37.7 % — ABNORMAL LOW (ref 39.0–52.0)
Hemoglobin: 12.6 g/dL — ABNORMAL LOW (ref 13.0–17.0)
MCH: 31.5 pg (ref 26.0–34.0)
MCHC: 33.4 g/dL (ref 30.0–36.0)
MCV: 94.3 fL (ref 80.0–100.0)
Platelets: 229 10*3/uL (ref 150–400)
RBC: 4 MIL/uL — ABNORMAL LOW (ref 4.22–5.81)
RDW: 12.9 % (ref 11.5–15.5)
WBC: 3.8 10*3/uL — ABNORMAL LOW (ref 4.0–10.5)
nRBC: 0 % (ref 0.0–0.2)

## 2024-01-14 LAB — D-DIMER, QUANTITATIVE: D-Dimer, Quant: <0.27 ug{FEU}/mL (ref 0.00–0.50)

## 2024-01-14 MED ORDER — PREDNISONE 20 MG PO TABS
40.0000 mg | ORAL_TABLET | Freq: Every day | ORAL | 0 refills | Status: AC
  Filled 2024-01-14: qty 8, 4d supply, fill #0

## 2024-01-14 NOTE — ED Triage Notes (Signed)
 Pt c/o left sided chest pain intermittently for past few weeks. Pt has had shortness of breath with pain, denies nausea or vomiting.

## 2024-01-14 NOTE — Discharge Instructions (Signed)
 As discussed, your evaluation today has been largely reassuring.  But, it is important that you monitor your condition carefully, and do not hesitate to return to the ED if you develop new, or concerning changes in your condition. ? ?Otherwise, please follow-up with your physician for appropriate ongoing care. ? ?

## 2024-01-14 NOTE — ED Provider Notes (Signed)
 [place] EMERGENCY DEPARTMENT AT [facility] Provider Note   CSN: [id number] Arrival date & time: 01/14/24  4098     Patient presents with: Chest Pain   John Hopkins is a 58 y.o. male.   HPI Presents with chest pain.  Pain has been present for several days, his left pectoral, with exacerbation with inspiration, coughing, laughing. Not exertional. Patient has used cocaine, smokes daily.  Last use today, 1 pack a day, respectively.  No dyspnea.  There is associated weight loss.    Prior to Admission medications   Medication Sig Start Date End Date Taking? Authorizing Provider  predniSONE  (DELTASONE ) 20 MG tablet Take 2 tablets (40 mg total) by mouth daily with breakfast. For the next four days 01/14/24  Yes Dorenda Gandy, MD    Allergies: Shrimp [shellfish allergy] and Aleve [naproxen]    Review of Systems  Updated Vital Signs BP (!) 124/100   Pulse (!) 56   Temp 98.2 F (36.8 C) (Oral)   Resp 15   Ht 5' 8 (1.727 m)   Wt 58.1 kg   SpO2 100%   BMI 19.46 kg/m   Physical Exam Vitals and nursing note reviewed.  Constitutional:      General: He is not in acute distress.    Appearance: He is well-developed.  HENT:     Head: Normocephalic and atraumatic.   Eyes:     Conjunctiva/sclera: Conjunctivae normal.    Cardiovascular:     Rate and Rhythm: Normal rate and regular rhythm.  Pulmonary:     Effort: Pulmonary effort is normal. No respiratory distress.     Breath sounds: No stridor.  Abdominal:     General: There is no distension.   Skin:    General: Skin is warm and dry.   Neurological:     Mental Status: He is alert and oriented to person, place, and time.     (all labs ordered are listed, but only abnormal results are displayed) Labs Reviewed  BASIC METABOLIC PANEL WITH GFR - Abnormal; Notable for the following components:      Result Value   CO2 19 (*)    All other components within normal limits  CBC - Abnormal; Notable for the  following components:   WBC 3.8 (*)    RBC 4.00 (*)    Hemoglobin 12.6 (*)    HCT 37.7 (*)    All other components within normal limits  D-DIMER, QUANTITATIVE  TROPONIN I (HIGH SENSITIVITY)  TROPONIN I (HIGH SENSITIVITY)    EKG: EKG Interpretation Date/Time:  Tuesday January 14 2024 08:22:58 EDT Ventricular Rate:  81 PR Interval:  184 QRS Duration:  100 QT Interval:  376 QTC Calculation: 436 R Axis:   80  Text Interpretation: Normal sinus rhythm Incomplete right bundle branch block Minimal voltage criteria for LVH, may be normal variant ( Cornell product ) Cannot rule out Anteroseptal infarct , age undetermined Abnormal ECG No significant change since last tracing Confirmed by Rosealee Concha ([id number]) on 01/14/2024 8:26:07 AM  Radiology: DG Chest 2 View Result Date: 01/14/2024 CLINICAL DATA:  chest pain EXAM: CHEST - 2 VIEW COMPARISON:  May 05, 2020 FINDINGS: Hyperexpanded lungs with flattening of the diaphragms. Biapical pleural thickening. No focal airspace consolidation, pleural effusion, or pneumothorax. No cardiomegaly. No acute fracture or destructive lesion. IMPRESSION: Hyperexpanded lungs with flattening of the diaphragms, which is suggestive of underlying emphysema. No pneumonia or pulmonary edema. Electronically Signed   By: Jodelle Mungo.D.  On: 01/14/2024 08:59     Procedures   Medications Ordered in the ED - No data to display                                  Medical Decision Making Adult male with history of ongoing cigarette use, cocaine use presents with left-sided chest pain.  Patient is awake, alert, hemodynamically unremarkable, cardiac 85 sinus normal pulse ox 100% room air normal differential includes chest pain associated with cocaine use, mass, ACS, pneumonia.  Amount and/or Complexity of Data Reviewed Labs: ordered. Decision-making details documented in ED Course. Radiology: ordered and independent interpretation performed. Decision-making details  documented in ED Course. ECG/medicine tests: ordered and independent interpretation performed. Decision-making details documented in ED Course.   1:02 PM Patient in no distress, smiling, sitting upright.  We discussed today's evaluation, negative D-dimer reassuring low suspicion for PE to normal troponin, low suspicion for ACS we discussed importance of cigarette smoking cessation as well as cocaine cessation.  Patient will follow-up with primary care.     Final diagnoses:  Atypical chest pain    ED Discharge Orders          Ordered    predniSONE  (DELTASONE ) 20 MG tablet  Daily with breakfast        01/14/24 1302               Dorenda Gandy, MD 01/14/24 1303

## 2024-03-17 ENCOUNTER — Ambulatory Visit (INDEPENDENT_AMBULATORY_CARE_PROVIDER_SITE_OTHER)

## 2024-03-17 ENCOUNTER — Encounter (HOSPITAL_COMMUNITY): Payer: Self-pay

## 2024-03-17 ENCOUNTER — Other Ambulatory Visit: Payer: Self-pay

## 2024-03-17 ENCOUNTER — Ambulatory Visit (HOSPITAL_COMMUNITY)
Admission: EM | Admit: 2024-03-17 | Discharge: 2024-03-17 | Disposition: A | Discharge status: Home / Self Care | Attending: Emergency Medicine | Admitting: Emergency Medicine

## 2024-03-17 ENCOUNTER — Emergency Department (HOSPITAL_COMMUNITY)
Admission: EM | Admit: 2024-03-17 | Discharge: 2024-03-17 | Disposition: A | Discharge status: Home / Self Care | Attending: Emergency Medicine | Admitting: Emergency Medicine

## 2024-03-17 ENCOUNTER — Encounter (HOSPITAL_COMMUNITY): Payer: Self-pay | Admitting: Emergency Medicine

## 2024-03-17 DIAGNOSIS — Y93E6 Activity, residential relocation: Secondary | ICD-10-CM | POA: Diagnosis not present

## 2024-03-17 DIAGNOSIS — W01198A Fall on same level from slipping, tripping and stumbling with subsequent striking against other object, initial encounter: Secondary | ICD-10-CM | POA: Diagnosis not present

## 2024-03-17 DIAGNOSIS — R030 Elevated blood-pressure reading, without diagnosis of hypertension: Secondary | ICD-10-CM

## 2024-03-17 DIAGNOSIS — R0781 Pleurodynia: Secondary | ICD-10-CM | POA: Diagnosis present

## 2024-03-17 DIAGNOSIS — R079 Chest pain, unspecified: Secondary | ICD-10-CM

## 2024-03-17 DIAGNOSIS — S20211A Contusion of right front wall of thorax, initial encounter: Secondary | ICD-10-CM | POA: Insufficient documentation

## 2024-03-17 DIAGNOSIS — F1910 Other psychoactive substance abuse, uncomplicated: Secondary | ICD-10-CM | POA: Diagnosis not present

## 2024-03-17 DIAGNOSIS — F17200 Nicotine dependence, unspecified, uncomplicated: Secondary | ICD-10-CM

## 2024-03-17 LAB — BASIC METABOLIC PANEL WITH GFR
Anion gap: 11 (ref 5–15)
BUN: 10 mg/dL (ref 6–20)
CO2: 23 mmol/L (ref 22–32)
Calcium: 9.2 mg/dL (ref 8.9–10.3)
Chloride: 104 mmol/L (ref 98–111)
Creatinine, Ser: 0.9 mg/dL (ref 0.61–1.24)
GFR, Estimated: >60 mL/min (ref >60)
Glucose, Bld: 97 mg/dL (ref 70–99)
Potassium: 3.3 mmol/L — ABNORMAL LOW (ref 3.5–5.1)
Sodium: 138 mmol/L (ref 135–145)

## 2024-03-17 LAB — CBC
HCT: 36.1 % — ABNORMAL LOW (ref 39.0–52.0)
Hemoglobin: 12.2 g/dL — ABNORMAL LOW (ref 13.0–17.0)
MCH: 32.4 pg (ref 26.0–34.0)
MCHC: 33.8 g/dL (ref 30.0–36.0)
MCV: 95.8 fL (ref 80.0–100.0)
Platelets: 214 K/uL (ref 150–400)
RBC: 3.77 MIL/uL — ABNORMAL LOW (ref 4.22–5.81)
RDW: 13.5 % (ref 11.5–15.5)
WBC: 3.6 K/uL — ABNORMAL LOW (ref 4.0–10.5)
nRBC: 0 % (ref 0.0–0.2)

## 2024-03-17 LAB — TROPONIN I (HIGH SENSITIVITY)
Troponin I (High Sensitivity): 3 ng/L (ref <18)
Troponin I (High Sensitivity): 5 ng/L (ref <18)

## 2024-03-17 NOTE — ED Provider Notes (Signed)
 [name] Provider Note   CSN: [id number] Arrival date & time: 03/17/24  1120     Patient presents with: Chest Pain   HOA John Hopkins is a 58 y.o. male.   Patient complains of right-sided chest pain.  Patient reports the pain began 1 week ago when he was moving a refrigerator.  Patient reports the refrigerator was heavy and he fell against it and struck his right ribs.  Patient reports he has been having pain since.  He denies any cough or congestion patient has not had any fever or chills.  Patient denies any left-sided chest pain he has not had any pain in his neck or down his arms.  Patient denies any shortness of breath.  No nausea or vomiting no sweating.  Patient was seen at the urgent care before being sent here patient was noted to have an abnormal EKG.  Patient had a right rib series which showed no fracture.   Chest Pain Pain location:  R chest Pain quality: aching   Duration:  1 week Timing:  Constant Chronicity:  New Relieved by:  Nothing Ineffective treatments:  None tried Associated symptoms: no abdominal pain        Prior to Admission medications   Not on File    Allergies: Shrimp [shellfish allergy] and Aleve [naproxen]    Review of Systems  Cardiovascular:  Positive for chest pain.  Gastrointestinal:  Negative for abdominal pain.  All other systems reviewed and are negative.   Updated Vital Signs BP 127/88   Pulse (!) 56   Temp 98 F (36.7 C) (Oral)   Resp 12   Ht 5' 8 (1.727 m)   Wt 59 kg   SpO2 100%   BMI 19.77 kg/m   Physical Exam Vitals and nursing note reviewed.  Constitutional:      Appearance: He is well-developed.  HENT:     Head: Normocephalic.  Cardiovascular:     Rate and Rhythm: Normal rate and regular rhythm.     Heart sounds: Normal heart sounds.  Pulmonary:     Effort: Pulmonary effort is normal.  Abdominal:     General: There is no distension.  Musculoskeletal:         General: Normal range of motion.     Cervical back: Normal range of motion.  Skin:    General: Skin is warm.  Neurological:     General: No focal deficit present.     Mental Status: He is alert and oriented to person, place, and time.  Psychiatric:        Mood and Affect: Mood normal.     (all labs ordered are listed, but only abnormal results are displayed) Labs Reviewed  BASIC METABOLIC PANEL WITH GFR - Abnormal; Notable for the following components:      Result Value   Potassium 3.3 (*)    All other components within normal limits  CBC - Abnormal; Notable for the following components:   WBC 3.6 (*)    RBC 3.77 (*)    Hemoglobin 12.2 (*)    HCT 36.1 (*)    All other components within normal limits  TROPONIN I (HIGH SENSITIVITY)  TROPONIN I (HIGH SENSITIVITY)    EKG: EKG Interpretation Date/Time:  Tuesday March 17 2024 11:30:42 EDT Ventricular Rate:  75 PR Interval:  192 QRS Duration:  106 QT Interval:  378 QTC Calculation: 422 R Axis:   66  Text Interpretation: Normal sinus rhythm  Incomplete right bundle branch block Minimal voltage criteria for LVH, may be normal variant ( Cornell product ) Cannot rule out Anteroseptal infarct , age undetermined Abnormal ECG No significant change since last tracing June 2025 Confirmed by John Hopkins [phone]) on 03/17/2024 3:00:00 PM  Radiology: [facility] Ribs Unilateral W/Chest Right Result Date: 03/17/2024 CLINICAL DATA:  Right rib pain after fall several days ago. EXAM: RIGHT RIBS AND CHEST - 3+ VIEW COMPARISON:  January 14, 2024. FINDINGS: No fracture or other bone lesions are seen involving the ribs. There is no evidence of pneumothorax or pleural effusion. Both lungs are clear. Heart size and mediastinal contours are within normal limits. IMPRESSION: Negative. Electronically Signed   By: Lynwood Landy Raddle M.D.   On: 03/17/2024 11:00     Procedures   Medications Ordered in the ED - No data to display                                   Medical Decision Making Complains of pain in his right ribs after trying to lift a refrigerator and falling against it 1 week ago.  Patient was seen at urgent care and had an abnormal EKG he was sent here for evaluation  Amount and/or Complexity of Data Reviewed External Data Reviewed: notes.    Details: Urgent care notes reviewed Labs: ordered. Decision-making details documented in ED Course.    Details: Troponin is negative Radiology: ordered and independent interpretation performed.    Details: Chest x-ray rib series from urgent care shows no evidence of rib fracture. ECG/medicine tests: ordered and independent interpretation performed. Decision-making details documented in ED Course.    Details: EKG ordered reviewed and interpreted EKG shows normal sinus no acute abnormality EKG is compared to EKG from June of this year and shows no acute change.  Risk Risk Details: Patient counseled on symptoms he is advised Tylenol  for discomfort return to the emergency department if any problems.        Final diagnoses:  Contusion of right chest wall, initial encounter    ED Discharge Orders     None       An After Visit Summary was printed and given to the patient.    John Hopkins, [place] 03/17/24 1507    John [place], MD 03/17/24 [phone]

## 2024-03-17 NOTE — ED Provider Notes (Signed)
 [facility]    CSN: [id number] Arrival date & time: 03/17/24  1005      History   Chief Complaint No chief complaint on file.   HPI John Hopkins is a 58 y.o. male.   58 year old male patient, John Hopkins, presents to urgent care for evaluation of right sided rib/chest pain since Thursday or Friday, fell onto ground carrying refrigerator, pt rates pain as 7/10, sharp. Pt states he took ibuprofen  for pain, reports being seen for similar issues in June at hospital and was prescribed prednisone  for stinging pain in chest. Pt endorses recent cocaine, alcohol and marijuana use.   PMH:  COPD, +smoker, cocaine use(last week),marijuana, alcohol  The history is provided by the patient. No language interpreter was used.    Past Medical History:  Diagnosis Date   COPD (chronic obstructive pulmonary disease) (HCC)    Left groin hernia    Medical history non-contributory     Patient Active Problem List   Diagnosis Date Noted   Right-sided chest pain 03/17/2024   Smoker 03/17/2024   Polysubstance abuse (HCC) 03/17/2024   Elevated blood pressure reading 03/17/2024    History reviewed. No pertinent surgical history.     Home Medications    Prior to Admission medications   Not on File    Family History Family History  Problem Relation Age of Onset   Healthy Mother    Healthy Father     Social History Social History   Tobacco Use   Smoking status: Every Day    Current packs/day: 1.00    Types: Cigarettes   Smokeless tobacco: Never  Vaping Use   Vaping status: Never Used  Substance Use Topics   Alcohol use: Yes    Alcohol/week: 7.0 standard drinks of alcohol    Types: 7 Cans of beer per week    Comment: 42oz   Drug use: Yes    Types: Marijuana     Allergies   Shrimp [shellfish allergy] and Aleve [naproxen]   Review of Systems Review of Systems  Constitutional:  Negative for fever.  Respiratory:  Negative for cough.    Cardiovascular:  Positive for chest pain. Negative for palpitations.  Skin: Negative.   All other systems reviewed and are negative.    Physical Exam Triage Vital Signs ED Triage Vitals  Encounter Vitals Group     BP      Girls Systolic BP Percentile      Girls Diastolic BP Percentile      Boys Systolic BP Percentile      Boys Diastolic BP Percentile      Pulse      Resp      Temp      Temp src      SpO2      Weight      Height      Head Circumference      Peak Flow      Pain Score      Pain Loc      Pain Education      Exclude from Growth Chart    No data found.  Updated Vital Signs BP (!) 124/90 (BP Location: Left Arm)   Pulse 86   Temp 98.4 F (36.9 C) (Oral)   Resp 18   SpO2 97%   Visual Acuity Right Eye Distance:   Left Eye Distance:   Bilateral Distance:    Right Eye Near:   Left Eye Near:    Bilateral Near:  Physical Exam Vitals and nursing note reviewed.  Cardiovascular:     Rate and Rhythm: Normal rate and regular rhythm.     Heart sounds: Normal heart sounds.  Pulmonary:     Effort: Pulmonary effort is normal.     Breath sounds: Normal breath sounds and air entry.  Chest:       Comments: Area marked area of reproducible pain with palpation and movement of right arm Neurological:     General: No focal deficit present.     Mental Status: He is alert and oriented to person, place, and time.     GCS: GCS eye subscore is 4. GCS verbal subscore is 5. GCS motor subscore is 6.  Psychiatric:        Attention and Perception: Attention normal.        Mood and Affect: Mood normal.        Speech: Speech normal.      UC Treatments / Results  Labs (all labs ordered are listed, but only abnormal results are displayed) Labs Reviewed - No data to display  EKG   Radiology DG Ribs Unilateral W/Chest Right Result Date: 03/17/2024 CLINICAL DATA:  Right rib pain after fall several days ago. EXAM: RIGHT RIBS AND CHEST - 3+ VIEW COMPARISON:   January 14, 2024. FINDINGS: No fracture or other bone lesions are seen involving the ribs. There is no evidence of pneumothorax or pleural effusion. Both lungs are clear. Heart size and mediastinal contours are within normal limits. IMPRESSION: Negative. Electronically Signed   By: Lynwood Landy Raddle M.D.   On: 03/17/2024 11:00    Procedures Procedures (including critical care time)  Medications Ordered in UC Medications - No data to display  Initial Impression / Assessment and Plan / UC Course  I have reviewed the triage vital signs and the nursing notes.  Pertinent labs & imaging results that were available during my care of the patient were reviewed by me and considered in my medical decision making (see chart for details).  Clinical Course as of 03/17/24 1141  Tue Mar 17, 2024  1039 EKG shows NSR, when compared to 01/14/24 EKG pt has changes in V1 and V2. Pt denies any active palpitations or chest pain reports chest wall pain.  [JD]    Clinical Course User Index [JD] Tanav Orsak, Rilla, NP   Discussed negative chest x-ray and EKG with changes noted.  Patient is aware of recommendation for further evaluation and work up in ER, cannot rule out cardiac issues, PE, expressed concern and recommendation to cease polysubstance use, pt verbalized understanding to this provider.   Ddx: Atypical chest pain, muscle strain, ACS, polysubstance use, COPD, viral illness Final Clinical Impressions(s) / UC Diagnoses   Final diagnoses:  Right-sided chest pain  Smoker  Polysubstance abuse (HCC)  Elevated blood pressure reading     Discharge Instructions      Your chest xray was negative for acute findings, no fractures, or infection seen Please go to the Er for further evaluation of your right sided chest pain, we are unable to r/o heart issues specifically MI. Recommend stop smoking as well as alcohol and drug use(cocaine,marijuana). Follow up with PCP regarding smoking cessation     ED  Prescriptions   None    PDMP not reviewed this encounter.   Aminta Rilla, NP 03/17/24 1141

## 2024-03-17 NOTE — ED Notes (Signed)
 Patient ambulatory to bathroom independently

## 2024-03-17 NOTE — ED Triage Notes (Signed)
 When asked about falls, patient then mentioned having fallen on right side while lifting a refrigerator on friday.

## 2024-03-17 NOTE — Discharge Instructions (Signed)
 Return if any problems.  Follow up with your Physician for recheck.

## 2024-03-17 NOTE — ED Triage Notes (Signed)
 Patient reports right sided chest pain after moving a refrigerator last week then falling on it. Patient reports taking prednisone  he had left over from previous injury which helped pain and 800mg  of ibuprofen  last night. Patient also reports pain is worse when he moves his arm in a certain direction.

## 2024-03-17 NOTE — ED Triage Notes (Signed)
 Friday started having pain.  Patient is very specific that if he lifts right arm, he has pain in right chest immediately.  Patient has had similar pain before.  No known injury.patient repeatedly is pushing on right chest reporting that it causes significant pain  Patient has taken ibuprofen , ?prednisone ?

## 2024-03-17 NOTE — Discharge Instructions (Addendum)
 Your chest xray was negative for acute findings, no fractures, or infection seen Please go to the Er for further evaluation of your right sided chest pain, we are unable to r/o heart issues specifically MI. Recommend stop smoking as well as alcohol and drug use(cocaine,marijuana). Follow up with PCP regarding smoking cessation

## 2024-03-17 NOTE — ED Notes (Signed)
Patient given food and beverage.

## 2024-06-10 ENCOUNTER — Other Ambulatory Visit: Payer: Self-pay | Admitting: Family Medicine

## 2024-06-10 DIAGNOSIS — R634 Abnormal weight loss: Secondary | ICD-10-CM

## 2024-06-10 DIAGNOSIS — F17219 Nicotine dependence, cigarettes, with unspecified nicotine-induced disorders: Secondary | ICD-10-CM

## 2024-06-11 ENCOUNTER — Other Ambulatory Visit: Payer: Self-pay | Admitting: Family Medicine

## 2024-06-11 DIAGNOSIS — R634 Abnormal weight loss: Secondary | ICD-10-CM

## 2024-06-11 DIAGNOSIS — F17219 Nicotine dependence, cigarettes, with unspecified nicotine-induced disorders: Secondary | ICD-10-CM

## 2024-06-11 DIAGNOSIS — Z122 Encounter for screening for malignant neoplasm of respiratory organs: Secondary | ICD-10-CM

## 2024-06-17 ENCOUNTER — Encounter: Payer: Self-pay | Admitting: Family Medicine

## 2024-06-23 ENCOUNTER — Inpatient Hospital Stay: Admission: RE | Admit: 2024-06-23 | Source: Ambulatory Visit

## 2024-08-11 ENCOUNTER — Ambulatory Visit (HOSPITAL_COMMUNITY)
Admission: EM | Admit: 2024-08-11 | Discharge: 2024-08-11 | Disposition: A | Discharge status: Home / Self Care | Attending: Family Medicine | Admitting: Family Medicine

## 2024-08-11 ENCOUNTER — Encounter (HOSPITAL_COMMUNITY): Payer: Self-pay | Admitting: Emergency Medicine

## 2024-08-11 DIAGNOSIS — L03011 Cellulitis of right finger: Secondary | ICD-10-CM | POA: Diagnosis not present

## 2024-08-11 MED ORDER — DOXYCYCLINE HYCLATE 100 MG PO TABS: 100.0000 mg | ORAL_TABLET | Freq: Two times a day (BID) | ORAL | 0 refills | Status: AC

## 2024-08-11 MED ORDER — BUPIVACAINE HCL (PF) 0.5 % IJ SOLN
INTRAMUSCULAR | Status: AC
  Filled 2024-08-11: qty 10

## 2024-08-11 NOTE — ED Triage Notes (Signed)
 Pt reports that right middle finger had been sore and then last night at bowling pain got worse. Reports swelling been about 3-4 days. Denies any drainage.

## 2024-08-11 NOTE — ED Provider Notes (Signed)
 " [facility]    CSN: [id number] Arrival date & time: 08/11/24  0932      History   Chief Complaint Chief Complaint  Patient presents with   Finger Injury    HPI [facility] is a 59 y.o. male.   He presents with a painful, red, and swollen finger after an injury at a bowling [place].  Digital trauma and local symptoms - Finger pain worsened after bowling - Redness and swelling developed by the morning after injury - Finger is sore and tender - Pain is worse with movement, especially when wiggling the finger forward - Less pain when pushing the finger upward  Systemic symptoms - No fever - Vague aching sensation in the arm  Financial concerns related to care - Concerned about treatment cost - Has Medicaid and hopes it will cover care  The history is provided by the patient.    Past Medical History:  Diagnosis Date   COPD (chronic obstructive pulmonary disease) (HCC)    Left groin hernia    Medical history non-contributory     Patient Active Problem List   Diagnosis Date Noted   Right-sided chest pain 03/17/2024   Smoker 03/17/2024   Polysubstance abuse (HCC) 03/17/2024   Elevated blood pressure reading 03/17/2024    History reviewed. No pertinent surgical history.     Home Medications    Prior to Admission medications  Medication Sig Start Date End Date Taking? Authorizing Provider  doxycycline  (VIBRA -TABS) 100 MG tablet Take 1 tablet (100 mg total) by mouth 2 (two) times daily for 7 days. 08/11/24 08/18/24 Yes Alba Sharper, MD    Family History Family History  Problem Relation Age of Onset   Healthy Mother    Healthy Father     Social History Social History[1]   Allergies   Shrimp [shellfish allergy] and Aleve [naproxen]   Review of Systems Review of Systems   Physical Exam Triage Vital Signs ED Triage Vitals  Encounter Vitals Group     BP 08/11/24 1043 (!) 124/90     Girls Systolic BP Percentile --      Girls  Diastolic BP Percentile --      Boys Systolic BP Percentile --      Boys Diastolic BP Percentile --      Pulse Rate 08/11/24 1043 86     Resp 08/11/24 1043 17     Temp 08/11/24 1043 98.9 F (37.2 C)     Temp Source 08/11/24 1043 Oral     SpO2 08/11/24 1043 96 %     Weight --      Height --      Head Circumference --      Peak Flow --      Pain Score 08/11/24 1042 10     Pain Loc --      Pain Education --      Exclude from Growth Chart --    No data found.  Updated Vital Signs BP (!) 124/90 (BP Location: Left Arm)   Pulse 86   Temp 98.9 F (37.2 C) (Oral)   Resp 17   SpO2 96%   Visual Acuity Right Eye Distance:   Left Eye Distance:   Bilateral Distance:    Right Eye Near:   Left Eye Near:    Bilateral Near:     Physical Exam Vitals and nursing note reviewed.  Constitutional:      General: He is not in acute distress.    Appearance:  He is well-developed.  HENT:     Head: Normocephalic and atraumatic.  Eyes:     Conjunctiva/sclera: Conjunctivae normal.  Cardiovascular:     Rate and Rhythm: Normal rate and regular rhythm.     Heart sounds: No murmur heard. Pulmonary:     Effort: Pulmonary effort is normal. No respiratory distress.     Breath sounds: Normal breath sounds.  Abdominal:     Palpations: Abdomen is soft.     Tenderness: There is no abdominal tenderness.  Musculoskeletal:        General: No swelling.     Right hand: Swelling and tenderness present. No bony tenderness. Normal sensation. Normal capillary refill.       Arms:       Hands:     Cervical back: Neck supple.     Comments: Erythema/swelling just proximal to nail bed of right middle finger. Fluctuance present Able to flex and extend DIP, some reproducible pain with this Negative Kanavel's Signs  Skin:    General: Skin is warm and dry.     Capillary Refill: Capillary refill takes less than 2 seconds.  Neurological:     Mental Status: He is alert.  Psychiatric:        Mood and Affect:  Mood normal.      UC Treatments / Results  Labs (all labs ordered are listed, but only abnormal results are displayed) Labs Reviewed - No data to display  EKG   Radiology No results found.  Procedures Incision and Drainage  Date/Time: 08/11/2024 11:32 AM  Performed by: Alba Sharper, MD Authorized by: Vonna Sharlet POUR, MD   Consent:    Consent obtained:  Verbal   Consent given by:  Patient   Risks, benefits, and alternatives were discussed: yes     Risks discussed:  Bleeding, incomplete drainage and pain   Alternatives discussed:  No treatment Universal protocol:    Procedure explained and questions answered to patient or proxy's satisfaction: yes     Patient identity confirmed:  Verbally with patient Location:    Type:  Fluid collection   Size:  1cm Pre-procedure details:    Skin preparation:  Povidone-iodine Sedation:    Sedation type:  None Anesthesia:    Anesthesia method:  Nerve block   Block anesthetic:  Bupivacaine  0.25% w/o epi   Block injection procedure:  Anatomic landmarks identified, introduced needle and negative aspiration for blood   Block outcome:  Anesthesia achieved Procedure type:    Complexity:  Simple Procedure details:    Ultrasound guidance: no     Needle aspiration: no     Incision types:  Stab incision   Incision depth:  Subungual   Drainage:  Purulent and bloody   Drainage amount:  Moderate   Wound treatment:  Wound left open   Packing materials:  None Post-procedure details:    Procedure completion:  Tolerated well, no immediate complications Comments:     Manually expressed 2-75ml of purulent drainage. Bandaid applied.  (including critical care time)  Medications Ordered in UC Medications - No data to display  Initial Impression / Assessment and Plan / UC Course  I have reviewed the triage vital signs and the nursing notes.  Pertinent labs & imaging results that were available during my care of the patient were  reviewed by me and considered in my medical decision making (see chart for details).     Clinically consistent with paronychia of the right middle digit.  Suspect that patient had  mild injury at his work at bowling already that broke the skin and introduced natural bacteria to the subungual area causing infection.  Reassuringly he maintains flexion and extension of the DIP at that joint, [place] signs are negative, and the erythema and swelling does not involve the DIP itself.  Discussed with patient and proceeded with incision and drainage as above.    Will send 7-day course of doxycycline  to patient's pharmacy as well. Directed avoid soaking in water for 48hrs. Okay to wash with soap and water after 24hrs. Care discussed with regular application of bandaid. Return precautions discussed.  Final Clinical Impressions(s) / UC Diagnoses   Final diagnoses:  Paronychia of right middle finger     Discharge Instructions      You have an infection of the nailbed of your middle finger. We drained the infection today. This usually treats the infection. I have also sent antibiotics to your pharmacy for you to take twice daily for the next 10 days. The antibiotics called doxycycline . Please take this with food, and make sure to wear hat or sunscreen as it can increase your chances of sunburn. If you have new redness swelling or fevers please seek care again.     ED Prescriptions     Medication Sig Dispense Auth. Provider   doxycycline  (VIBRA -TABS) 100 MG tablet Take 1 tablet (100 mg total) by mouth 2 (two) times daily for 7 days. 14 tablet Consuella Scurlock, MD      PDMP not reviewed this encounter.       [1]  Social History Tobacco Use   Smoking status: Every Day    Current packs/day: 1.00    Types: Cigarettes   Smokeless tobacco: Never  Vaping Use   Vaping status: Never Used  Substance Use Topics   Alcohol use: Yes    Alcohol/week: 7.0 standard drinks of alcohol    Types:  7 Cans of beer per week    Comment: 42oz   Drug use: Yes    Types: Marijuana     Alba Sharper, MD 08/11/24 1201  "

## 2024-08-11 NOTE — Discharge Instructions (Addendum)
 You have an infection of the nailbed of your middle finger. We drained the infection today. This usually treats the infection. I have also sent antibiotics to your pharmacy for you to take twice daily for the next 10 days. The antibiotics called doxycycline . Please take this with food, and make sure to wear hat or sunscreen as it can increase your chances of sunburn. If you have new redness swelling or fevers please seek care again.
